# Patient Record
Sex: Female | Born: 1960 | Race: Black or African American | Hispanic: No | Marital: Single | Smoking: Never smoker
Health system: Southern US, Community
[De-identification: ages and names within clinical notes are randomized; demographics above are authoritative.]

## PROBLEM LIST (undated history)

## (undated) HISTORY — PX: KNEE SURGERY: SHX244

---

## 1998-01-31 ENCOUNTER — Encounter: Payer: Self-pay | Admitting: Orthopedic Surgery

## 1998-02-04 ENCOUNTER — Ambulatory Visit (HOSPITAL_COMMUNITY): Admission: RE | Admit: 1998-02-04 | Discharge: 1998-02-04 | Payer: Self-pay | Admitting: Orthopedic Surgery

## 1998-03-18 ENCOUNTER — Encounter: Admission: RE | Admit: 1998-03-18 | Discharge: 1998-05-16 | Payer: Self-pay | Admitting: Orthopedic Surgery

## 2007-03-09 HISTORY — PX: LAPAROSCOPIC GASTRIC BANDING: SHX1100

## 2012-04-02 DIAGNOSIS — M25579 Pain in unspecified ankle and joints of unspecified foot: Secondary | ICD-10-CM | POA: Insufficient documentation

## 2012-04-02 DIAGNOSIS — M7989 Other specified soft tissue disorders: Secondary | ICD-10-CM | POA: Insufficient documentation

## 2012-04-02 DIAGNOSIS — M25569 Pain in unspecified knee: Secondary | ICD-10-CM | POA: Insufficient documentation

## 2012-06-03 DIAGNOSIS — I10 Essential (primary) hypertension: Secondary | ICD-10-CM | POA: Insufficient documentation

## 2012-06-03 DIAGNOSIS — G40909 Epilepsy, unspecified, not intractable, without status epilepticus: Secondary | ICD-10-CM | POA: Insufficient documentation

## 2014-06-21 DIAGNOSIS — R7303 Prediabetes: Secondary | ICD-10-CM | POA: Insufficient documentation

## 2014-08-22 DIAGNOSIS — G40909 Epilepsy, unspecified, not intractable, without status epilepticus: Secondary | ICD-10-CM | POA: Insufficient documentation

## 2017-01-10 DIAGNOSIS — H905 Unspecified sensorineural hearing loss: Secondary | ICD-10-CM | POA: Insufficient documentation

## 2017-05-05 DIAGNOSIS — D500 Iron deficiency anemia secondary to blood loss (chronic): Secondary | ICD-10-CM | POA: Insufficient documentation

## 2018-01-13 DIAGNOSIS — E876 Hypokalemia: Secondary | ICD-10-CM | POA: Insufficient documentation

## 2018-03-09 DIAGNOSIS — Z30433 Encounter for removal and reinsertion of intrauterine contraceptive device: Secondary | ICD-10-CM | POA: Diagnosis not present

## 2018-03-22 DIAGNOSIS — Z6841 Body Mass Index (BMI) 40.0 and over, adult: Secondary | ICD-10-CM | POA: Diagnosis not present

## 2018-03-22 DIAGNOSIS — I10 Essential (primary) hypertension: Secondary | ICD-10-CM | POA: Diagnosis not present

## 2018-03-22 DIAGNOSIS — Z09 Encounter for follow-up examination after completed treatment for conditions other than malignant neoplasm: Secondary | ICD-10-CM | POA: Diagnosis not present

## 2018-03-22 DIAGNOSIS — I82403 Acute embolism and thrombosis of unspecified deep veins of lower extremity, bilateral: Secondary | ICD-10-CM | POA: Diagnosis not present

## 2018-03-22 DIAGNOSIS — F329 Major depressive disorder, single episode, unspecified: Secondary | ICD-10-CM | POA: Diagnosis not present

## 2018-03-29 DIAGNOSIS — F329 Major depressive disorder, single episode, unspecified: Secondary | ICD-10-CM | POA: Diagnosis not present

## 2018-04-03 DIAGNOSIS — F329 Major depressive disorder, single episode, unspecified: Secondary | ICD-10-CM | POA: Diagnosis not present

## 2018-04-06 MED FILL — TROKENDI XR 100 MG CAPSULE: 100 | 30 days supply | Qty: 30 | Fill #0

## 2018-04-10 DIAGNOSIS — Z30431 Encounter for routine checking of intrauterine contraceptive device: Secondary | ICD-10-CM | POA: Diagnosis not present

## 2018-04-10 DIAGNOSIS — F329 Major depressive disorder, single episode, unspecified: Secondary | ICD-10-CM | POA: Diagnosis not present

## 2018-04-12 MED FILL — TRIAMTERENE/HCTZ 37.5/25 TB: 37.5-25 | 30 days supply | Qty: 30 | Fill #0

## 2018-04-13 DIAGNOSIS — G40909 Epilepsy, unspecified, not intractable, without status epilepticus: Secondary | ICD-10-CM | POA: Diagnosis not present

## 2018-04-13 DIAGNOSIS — Z5181 Encounter for therapeutic drug level monitoring: Secondary | ICD-10-CM | POA: Diagnosis not present

## 2018-05-04 DIAGNOSIS — D500 Iron deficiency anemia secondary to blood loss (chronic): Secondary | ICD-10-CM | POA: Diagnosis not present

## 2018-05-04 DIAGNOSIS — I825Y9 Chronic embolism and thrombosis of unspecified deep veins of unspecified proximal lower extremity: Secondary | ICD-10-CM | POA: Diagnosis not present

## 2018-05-04 MED FILL — TROKENDI XR 100 MG CAPSULE: 100 | 90 days supply | Qty: 90 | Fill #0

## 2018-05-12 MED FILL — TRIAMTERENE/HCTZ 37.5/25 TB: 37.5-25 | 30 days supply | Qty: 30 | Fill #0

## 2018-05-15 DIAGNOSIS — Z6841 Body Mass Index (BMI) 40.0 and over, adult: Secondary | ICD-10-CM | POA: Diagnosis not present

## 2018-05-15 DIAGNOSIS — I82409 Acute embolism and thrombosis of unspecified deep veins of unspecified lower extremity: Secondary | ICD-10-CM | POA: Diagnosis not present

## 2018-05-15 DIAGNOSIS — F329 Major depressive disorder, single episode, unspecified: Secondary | ICD-10-CM | POA: Diagnosis not present

## 2018-05-15 DIAGNOSIS — D500 Iron deficiency anemia secondary to blood loss (chronic): Secondary | ICD-10-CM | POA: Diagnosis not present

## 2018-05-15 DIAGNOSIS — R7303 Prediabetes: Secondary | ICD-10-CM | POA: Diagnosis not present

## 2018-05-15 DIAGNOSIS — I10 Essential (primary) hypertension: Secondary | ICD-10-CM | POA: Diagnosis not present

## 2018-05-24 MED FILL — ESCITALOPRAM 20 MG TABLET: 20 | 30 days supply | Qty: 30 | Fill #0

## 2018-06-08 MED FILL — TRIAMTERENE/HCTZ 37.5/25 TB: 37.5-25 | 90 days supply | Qty: 90 | Fill #0

## 2018-06-08 MED FILL — ESCITALOPRAM 20 MG TABLET: 20 | 90 days supply | Qty: 90 | Fill #0

## 2018-08-03 MED FILL — TROKENDI XR 100 MG CAPSULE: 100 | 90 days supply | Qty: 90 | Fill #1

## 2018-09-04 MED FILL — TRIAMTERENE/HCTZ 37.5/25 TB: 37.5-25 | 90 days supply | Qty: 90 | Fill #0

## 2018-09-04 MED FILL — ESCITALOPRAM 20 MG TABLET: 20 | 90 days supply | Qty: 90 | Fill #0

## 2018-11-02 DIAGNOSIS — I8290 Acute embolism and thrombosis of unspecified vein: Secondary | ICD-10-CM | POA: Diagnosis not present

## 2018-11-02 DIAGNOSIS — D500 Iron deficiency anemia secondary to blood loss (chronic): Secondary | ICD-10-CM | POA: Diagnosis not present

## 2018-11-02 MED FILL — TROKENDI XR 100 MG CAPSULE: 100 | 90 days supply | Qty: 90 | Fill #2

## 2018-11-24 MED FILL — XARELTO 20 MG TABLET: 20 | 30 days supply | Qty: 30 | Fill #0

## 2018-11-27 DIAGNOSIS — B373 Candidiasis of vulva and vagina: Secondary | ICD-10-CM | POA: Diagnosis not present

## 2018-11-27 DIAGNOSIS — Z202 Contact with and (suspected) exposure to infections with a predominantly sexual mode of transmission: Secondary | ICD-10-CM | POA: Diagnosis not present

## 2018-11-27 DIAGNOSIS — Z6841 Body Mass Index (BMI) 40.0 and over, adult: Secondary | ICD-10-CM | POA: Diagnosis not present

## 2018-11-28 DIAGNOSIS — Z202 Contact with and (suspected) exposure to infections with a predominantly sexual mode of transmission: Secondary | ICD-10-CM | POA: Diagnosis not present

## 2018-12-07 ENCOUNTER — Encounter: Payer: Self-pay | Admitting: Psychiatry

## 2018-12-07 ENCOUNTER — Ambulatory Visit (INDEPENDENT_AMBULATORY_CARE_PROVIDER_SITE_OTHER): Payer: 59 | Admitting: Psychiatry

## 2018-12-07 ENCOUNTER — Other Ambulatory Visit: Payer: Self-pay

## 2018-12-07 DIAGNOSIS — F331 Major depressive disorder, recurrent, moderate: Secondary | ICD-10-CM

## 2018-12-07 MED FILL — TRIAMTERENE-HCTZ 37.5-25 MG: 37.5-25 | 90 days supply | Qty: 90 | Fill #0

## 2018-12-07 MED FILL — ESCITALOPRAM 20 MG TABLET: 20 | 90 days supply | Qty: 90 | Fill #0
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
## 2018-12-28 ENCOUNTER — Ambulatory Visit: Payer: 59 | Admitting: Psychiatry

## 2018-12-28 DIAGNOSIS — F329 Major depressive disorder, single episode, unspecified: Secondary | ICD-10-CM | POA: Diagnosis not present

## 2018-12-28 DIAGNOSIS — Z13220 Encounter for screening for lipoid disorders: Secondary | ICD-10-CM | POA: Diagnosis not present

## 2018-12-28 DIAGNOSIS — I10 Essential (primary) hypertension: Secondary | ICD-10-CM | POA: Diagnosis not present

## 2018-12-28 DIAGNOSIS — I82409 Acute embolism and thrombosis of unspecified deep veins of unspecified lower extremity: Secondary | ICD-10-CM | POA: Diagnosis not present

## 2018-12-28 DIAGNOSIS — R7303 Prediabetes: Secondary | ICD-10-CM | POA: Diagnosis not present

## 2018-12-28 DIAGNOSIS — J309 Allergic rhinitis, unspecified: Secondary | ICD-10-CM | POA: Diagnosis not present

## 2018-12-28 DIAGNOSIS — D500 Iron deficiency anemia secondary to blood loss (chronic): Secondary | ICD-10-CM | POA: Diagnosis not present

## 2018-12-28 DIAGNOSIS — Z1329 Encounter for screening for other suspected endocrine disorder: Secondary | ICD-10-CM | POA: Diagnosis not present

## 2018-12-28 DIAGNOSIS — E876 Hypokalemia: Secondary | ICD-10-CM | POA: Diagnosis not present

## 2018-12-29 MED FILL — XARELTO 20 MG TABLET: 20 | 30 days supply | Qty: 30 | Fill #1

## 2018-12-29 MED FILL — FLUTICASONE PROP 50 MCG SPR: 50 | 60 days supply | Qty: 16 | Fill #0

## 2019-01-01 MED FILL — POTASSIUM CHLORIDE CRYS ER: 10 | 5 days supply | Qty: 5 | Fill #0

## 2019-01-10 DIAGNOSIS — E876 Hypokalemia: Secondary | ICD-10-CM | POA: Diagnosis not present

## 2019-01-30 DIAGNOSIS — F331 Major depressive disorder, recurrent, moderate: Secondary | ICD-10-CM | POA: Diagnosis not present

## 2019-01-31 DIAGNOSIS — Z20828 Contact with and (suspected) exposure to other viral communicable diseases: Secondary | ICD-10-CM | POA: Diagnosis not present

## 2019-02-02 MED FILL — TROKENDI XR 100 MG CAPSULE: 100 | 90 days supply | Qty: 90 | Fill #0

## 2019-02-02 MED FILL — XARELTO 20 MG TABLET: 20 | 30 days supply | Qty: 30 | Fill #0 | Status: TO

## 2019-02-05 DIAGNOSIS — Z1231 Encounter for screening mammogram for malignant neoplasm of breast: Secondary | ICD-10-CM | POA: Diagnosis not present

## 2019-02-26 MED FILL — XARELTO 20 MG TABLET: 20 | 30 days supply | Qty: 30 | Fill #1 | Status: TO

## 2019-03-08 MED FILL — XARELTO 20 MG TABLET: 20 | 30 days supply | Qty: 30 | Fill #1 | Status: TO

## 2019-03-12 MED FILL — ESCITALOPRAM 20 MG TABLET: 20 | 90 days supply | Qty: 90 | Fill #0

## 2019-03-12 MED FILL — TRIAMTERENE-HCTZ 37.5-25 MG: 37.5-25 | 30 days supply | Qty: 30 | Fill #0

## 2019-04-09 MED FILL — XARELTO 20 MG TABLET: 20 | 30 days supply | Qty: 30 | Fill #2 | Status: TO

## 2019-04-10 MED FILL — TRIAMTERENE/HCTZ 37.5/25 TB: 37.5-25 | 90 days supply | Qty: 90 | Fill #1

## 2019-05-08 MED FILL — XARELTO 20 MG TABLET: 20 | 30 days supply | Qty: 30 | Fill #0 | Status: TO

## 2019-05-11 MED FILL — TROKENDI XR 100 MG CAPSULE: 100 | 30 days supply | Qty: 30 | Fill #0

## 2019-05-14 ENCOUNTER — Other Ambulatory Visit (HOSPITAL_COMMUNITY): Payer: Self-pay | Admitting: Nurse Practitioner

## 2019-05-14 DIAGNOSIS — E569 Vitamin deficiency, unspecified: Secondary | ICD-10-CM | POA: Insufficient documentation

## 2019-05-14 DIAGNOSIS — Z5181 Encounter for therapeutic drug level monitoring: Secondary | ICD-10-CM | POA: Diagnosis not present

## 2019-05-14 DIAGNOSIS — Z79899 Other long term (current) drug therapy: Secondary | ICD-10-CM | POA: Diagnosis not present

## 2019-05-14 DIAGNOSIS — G40909 Epilepsy, unspecified, not intractable, without status epilepticus: Secondary | ICD-10-CM | POA: Diagnosis not present

## 2019-05-18 MED FILL — VIT D2 1.25 MG (50,000 UNIT: 1.25 MG | 84 days supply | Qty: 12 | Fill #0

## 2019-06-07 DIAGNOSIS — E876 Hypokalemia: Secondary | ICD-10-CM | POA: Diagnosis not present

## 2019-06-07 DIAGNOSIS — J309 Allergic rhinitis, unspecified: Secondary | ICD-10-CM | POA: Diagnosis not present

## 2019-06-07 DIAGNOSIS — D500 Iron deficiency anemia secondary to blood loss (chronic): Secondary | ICD-10-CM | POA: Diagnosis not present

## 2019-06-07 DIAGNOSIS — F329 Major depressive disorder, single episode, unspecified: Secondary | ICD-10-CM | POA: Diagnosis not present

## 2019-06-07 DIAGNOSIS — I82409 Acute embolism and thrombosis of unspecified deep veins of unspecified lower extremity: Secondary | ICD-10-CM | POA: Diagnosis not present

## 2019-06-07 DIAGNOSIS — I10 Essential (primary) hypertension: Secondary | ICD-10-CM | POA: Diagnosis not present

## 2019-06-07 DIAGNOSIS — R7303 Prediabetes: Secondary | ICD-10-CM | POA: Diagnosis not present

## 2019-06-14 MED FILL — XARELTO 20 MG TABLET: 20 | 30 days supply | Qty: 30 | Fill #1 | Status: TO

## 2019-06-15 MED FILL — TROKENDI XR 100 MG CAPSULE: 100 | 90 days supply | Qty: 90 | Fill #0

## 2019-07-10 MED FILL — XARELTO 20 MG TABLET: 20 | 30 days supply | Qty: 30 | Fill #2 | Status: TO

## 2019-07-10 MED FILL — ESCITALOPRAM 20 MG TABLET: 20 | 90 days supply | Qty: 90 | Fill #1

## 2019-08-02 DIAGNOSIS — Z09 Encounter for follow-up examination after completed treatment for conditions other than malignant neoplasm: Secondary | ICD-10-CM | POA: Diagnosis not present

## 2019-08-02 DIAGNOSIS — Z7901 Long term (current) use of anticoagulants: Secondary | ICD-10-CM | POA: Diagnosis not present

## 2019-08-02 DIAGNOSIS — Z9884 Bariatric surgery status: Secondary | ICD-10-CM | POA: Diagnosis not present

## 2019-08-02 DIAGNOSIS — D500 Iron deficiency anemia secondary to blood loss (chronic): Secondary | ICD-10-CM | POA: Diagnosis not present

## 2019-08-02 DIAGNOSIS — Z862 Personal history of diseases of the blood and blood-forming organs and certain disorders involving the immune mechanism: Secondary | ICD-10-CM | POA: Diagnosis not present

## 2019-08-02 DIAGNOSIS — Z86718 Personal history of other venous thrombosis and embolism: Secondary | ICD-10-CM | POA: Diagnosis not present

## 2019-08-08 DIAGNOSIS — Z01419 Encounter for gynecological examination (general) (routine) without abnormal findings: Secondary | ICD-10-CM | POA: Diagnosis not present

## 2019-08-09 DIAGNOSIS — Z113 Encounter for screening for infections with a predominantly sexual mode of transmission: Secondary | ICD-10-CM | POA: Diagnosis not present

## 2019-09-17 MED FILL — TROKENDI XR 100 MG CAPSULE: 100 | 90 days supply | Qty: 90 | Fill #1

## 2019-09-17 MED FILL — TRIAMTERENE-HCTZ 37.5-25 MG: 37.5-25 | 60 days supply | Qty: 60 | Fill #0

## 2019-09-24 ENCOUNTER — Other Ambulatory Visit (HOSPITAL_COMMUNITY): Payer: Self-pay

## 2019-09-25 MED FILL — XARELTO 20 MG TABLET: 20 | 30 days supply | Qty: 30 | Fill #0

## 2019-10-02 DIAGNOSIS — H9201 Otalgia, right ear: Secondary | ICD-10-CM | POA: Diagnosis not present

## 2019-10-02 DIAGNOSIS — Z6841 Body Mass Index (BMI) 40.0 and over, adult: Secondary | ICD-10-CM | POA: Diagnosis not present

## 2019-10-15 ENCOUNTER — Other Ambulatory Visit (HOSPITAL_COMMUNITY): Payer: Self-pay

## 2019-10-15 MED FILL — ESCITALOPRAM 20 MG TABLET: 20 | 90 days supply | Qty: 90 | Fill #0

## 2019-10-23 MED FILL — XARELTO 20 MG TABLET: 20 | 30 days supply | Qty: 30 | Fill #1

## 2019-11-02 DIAGNOSIS — Z23 Encounter for immunization: Secondary | ICD-10-CM | POA: Diagnosis not present

## 2019-11-20 MED FILL — XARELTO 20 MG TABLET: 20 | 30 days supply | Qty: 30 | Fill #2

## 2019-11-22 MED FILL — TRIAMTERENE-HCTZ 37.5-25 MG: 37.5-25 | 60 days supply | Qty: 60 | Fill #1

## 2019-12-12 DIAGNOSIS — Z20828 Contact with and (suspected) exposure to other viral communicable diseases: Secondary | ICD-10-CM | POA: Diagnosis not present

## 2019-12-12 DIAGNOSIS — D500 Iron deficiency anemia secondary to blood loss (chronic): Secondary | ICD-10-CM | POA: Diagnosis not present

## 2019-12-24 MED FILL — TROKENDI XR 100 MG CAPSULE: 100 | 90 days supply | Qty: 90 | Fill #2

## 2019-12-24 MED FILL — XARELTO 20 MG TABLET: 20 | 30 days supply | Qty: 30 | Fill #3

## 2020-01-16 ENCOUNTER — Other Ambulatory Visit (HOSPITAL_COMMUNITY): Payer: Self-pay | Admitting: Family Medicine

## 2020-01-16 MED FILL — TRIAMTERENE-HCTZ 37.5-25 MG: 37.5-25 | 90 days supply | Qty: 90 | Fill #0

## 2020-01-16 MED FILL — ESCITALOPRAM 20 MG TABLET: 20 | 90 days supply | Qty: 90 | Fill #1

## 2020-01-17 MED FILL — XARELTO 20 MG TABLET: 20 | 30 days supply | Qty: 30 | Fill #4

## 2020-02-19 MED FILL — XARELTO 20 MG TABLET: 20 | 30 days supply | Qty: 30 | Fill #5

## 2020-03-05 DIAGNOSIS — Z20822 Contact with and (suspected) exposure to covid-19: Secondary | ICD-10-CM | POA: Diagnosis not present

## 2020-03-25 MED FILL — TROKENDI XR 100 MG CAPSULE: 100 | 90 days supply | Qty: 90 | Fill #3

## 2020-03-25 MED FILL — XARELTO 20 MG TABLET: 20 | 30 days supply | Qty: 30 | Fill #6

## 2020-04-02 DIAGNOSIS — Z1231 Encounter for screening mammogram for malignant neoplasm of breast: Secondary | ICD-10-CM | POA: Diagnosis not present

## 2020-04-02 DIAGNOSIS — Z975 Presence of (intrauterine) contraceptive device: Secondary | ICD-10-CM | POA: Diagnosis not present

## 2020-04-02 DIAGNOSIS — N930 Postcoital and contact bleeding: Secondary | ICD-10-CM | POA: Diagnosis not present

## 2020-04-02 DIAGNOSIS — F4321 Adjustment disorder with depressed mood: Secondary | ICD-10-CM | POA: Diagnosis not present

## 2020-04-02 DIAGNOSIS — N951 Menopausal and female climacteric states: Secondary | ICD-10-CM | POA: Diagnosis not present

## 2020-04-02 DIAGNOSIS — Z6841 Body Mass Index (BMI) 40.0 and over, adult: Secondary | ICD-10-CM | POA: Diagnosis not present

## 2020-04-02 MED FILL — ESCITALOPRAM 10 MG TABLET: 10 | 30 days supply | Qty: 30 | Fill #0

## 2020-04-03 ENCOUNTER — Other Ambulatory Visit (HOSPITAL_COMMUNITY): Payer: Self-pay

## 2020-04-03 DIAGNOSIS — Z7901 Long term (current) use of anticoagulants: Secondary | ICD-10-CM | POA: Diagnosis not present

## 2020-04-03 DIAGNOSIS — Z6841 Body Mass Index (BMI) 40.0 and over, adult: Secondary | ICD-10-CM | POA: Diagnosis not present

## 2020-04-03 DIAGNOSIS — D500 Iron deficiency anemia secondary to blood loss (chronic): Secondary | ICD-10-CM | POA: Diagnosis not present

## 2020-04-03 DIAGNOSIS — Z9884 Bariatric surgery status: Secondary | ICD-10-CM | POA: Diagnosis not present

## 2020-04-03 DIAGNOSIS — Z86718 Personal history of other venous thrombosis and embolism: Secondary | ICD-10-CM | POA: Diagnosis not present

## 2020-04-03 DIAGNOSIS — Z1231 Encounter for screening mammogram for malignant neoplasm of breast: Secondary | ICD-10-CM | POA: Diagnosis not present

## 2020-04-17 ENCOUNTER — Other Ambulatory Visit (HOSPITAL_COMMUNITY): Payer: Self-pay | Admitting: Neurology

## 2020-04-17 DIAGNOSIS — R87612 Low grade squamous intraepithelial lesion on cytologic smear of cervix (LGSIL): Secondary | ICD-10-CM | POA: Diagnosis not present

## 2020-04-17 DIAGNOSIS — Z975 Presence of (intrauterine) contraceptive device: Secondary | ICD-10-CM | POA: Diagnosis not present

## 2020-04-17 DIAGNOSIS — N938 Other specified abnormal uterine and vaginal bleeding: Secondary | ICD-10-CM | POA: Diagnosis not present

## 2020-04-17 DIAGNOSIS — R9389 Abnormal findings on diagnostic imaging of other specified body structures: Secondary | ICD-10-CM | POA: Diagnosis not present

## 2020-04-17 DIAGNOSIS — N951 Menopausal and female climacteric states: Secondary | ICD-10-CM | POA: Diagnosis not present

## 2020-04-17 DIAGNOSIS — G40909 Epilepsy, unspecified, not intractable, without status epilepticus: Secondary | ICD-10-CM | POA: Diagnosis not present

## 2020-04-18 DIAGNOSIS — D500 Iron deficiency anemia secondary to blood loss (chronic): Secondary | ICD-10-CM | POA: Diagnosis not present

## 2020-04-22 ENCOUNTER — Other Ambulatory Visit (HOSPITAL_COMMUNITY): Payer: Self-pay | Admitting: Family Medicine

## 2020-04-22 MED FILL — TRIAMTERENE-HCTZ 37.5-25 MG: 37.5-25 | 90 days supply | Qty: 90 | Fill #0

## 2020-04-30 MED FILL — XARELTO 20 MG TABLET: 20 | 90 days supply | Qty: 90 | Fill #0

## 2020-05-01 ENCOUNTER — Other Ambulatory Visit (HOSPITAL_COMMUNITY): Payer: Self-pay | Admitting: Gynecology

## 2020-05-01 MED FILL — miSOPROStol 200 MCG TABS: 200 | 1 days supply | Qty: 2 | Fill #0

## 2020-05-02 ENCOUNTER — Other Ambulatory Visit (HOSPITAL_COMMUNITY): Payer: Self-pay | Admitting: Family Medicine

## 2020-05-02 MED FILL — ESCITALOPRAM 20 MG TABLET: 20 | 90 days supply | Qty: 90 | Fill #0

## 2020-06-02 ENCOUNTER — Other Ambulatory Visit (HOSPITAL_COMMUNITY): Payer: Self-pay | Admitting: Gynecology

## 2020-06-02 MED FILL — miSOPROStol 200 MCG TABS: 200 | 1 days supply | Qty: 2 | Fill #0

## 2020-06-04 DIAGNOSIS — Z30433 Encounter for removal and reinsertion of intrauterine contraceptive device: Secondary | ICD-10-CM | POA: Diagnosis not present

## 2020-06-19 ENCOUNTER — Other Ambulatory Visit (HOSPITAL_COMMUNITY): Payer: Self-pay

## 2020-06-19 DIAGNOSIS — D500 Iron deficiency anemia secondary to blood loss (chronic): Secondary | ICD-10-CM | POA: Diagnosis not present

## 2020-06-19 DIAGNOSIS — F341 Dysthymic disorder: Secondary | ICD-10-CM | POA: Diagnosis not present

## 2020-06-19 DIAGNOSIS — I10 Essential (primary) hypertension: Secondary | ICD-10-CM | POA: Diagnosis not present

## 2020-06-19 DIAGNOSIS — I82409 Acute embolism and thrombosis of unspecified deep veins of unspecified lower extremity: Secondary | ICD-10-CM | POA: Diagnosis not present

## 2020-06-19 DIAGNOSIS — R7303 Prediabetes: Secondary | ICD-10-CM | POA: Diagnosis not present

## 2020-06-19 DIAGNOSIS — D649 Anemia, unspecified: Secondary | ICD-10-CM | POA: Diagnosis not present

## 2020-06-19 DIAGNOSIS — E7800 Pure hypercholesterolemia, unspecified: Secondary | ICD-10-CM | POA: Insufficient documentation

## 2020-06-19 DIAGNOSIS — Z9884 Bariatric surgery status: Secondary | ICD-10-CM | POA: Diagnosis not present

## 2020-06-19 MED ORDER — FLUOXETINE HCL 40 MG PO CAPS
40.0000 mg | ORAL_CAPSULE | Freq: Every day | ORAL | 3 refills | Status: DC
  Filled 2020-06-19: qty 30, 30d supply, fill #0
  Filled 2020-07-25: qty 30, 30d supply, fill #1
  Filled 2020-08-26: qty 30, 30d supply, fill #2
  Filled 2020-09-26: qty 30, 30d supply, fill #3

## 2020-06-19 MED ORDER — TRIAMTERENE-HCTZ 37.5-25 MG PO TABS
1.0000 | ORAL_TABLET | Freq: Every day | ORAL | 1 refills | Status: DC
  Filled 2020-06-19 – 2020-07-24 (×2): qty 90, 90d supply, fill #0
  Filled 2020-10-20: qty 90, 90d supply, fill #1

## 2020-06-20 ENCOUNTER — Other Ambulatory Visit (HOSPITAL_COMMUNITY): Payer: Self-pay

## 2020-06-20 MED ORDER — POTASSIUM CHLORIDE CRYS ER 10 MEQ PO TBCR
10.0000 meq | EXTENDED_RELEASE_TABLET | Freq: Every day | ORAL | 1 refills | Status: DC
  Filled 2020-06-20: qty 90, 90d supply, fill #0
  Filled 2020-09-26: qty 90, 90d supply, fill #1

## 2020-06-24 ENCOUNTER — Other Ambulatory Visit (HOSPITAL_COMMUNITY): Payer: Self-pay

## 2020-06-26 ENCOUNTER — Other Ambulatory Visit (HOSPITAL_COMMUNITY): Payer: Self-pay

## 2020-06-28 ENCOUNTER — Other Ambulatory Visit (HOSPITAL_COMMUNITY): Payer: Self-pay

## 2020-06-30 ENCOUNTER — Other Ambulatory Visit (HOSPITAL_COMMUNITY): Payer: Self-pay

## 2020-07-01 ENCOUNTER — Other Ambulatory Visit (HOSPITAL_COMMUNITY): Payer: Self-pay

## 2020-07-02 ENCOUNTER — Other Ambulatory Visit (HOSPITAL_COMMUNITY): Payer: Self-pay

## 2020-07-02 MED FILL — Topiramate Cap ER 24HR 100 MG: ORAL | 90 days supply | Qty: 90 | Fill #0 | Status: AC

## 2020-07-03 ENCOUNTER — Other Ambulatory Visit (HOSPITAL_COMMUNITY): Payer: Self-pay

## 2020-07-03 MED ORDER — TROKENDI XR 100 MG PO CP24
1.0000 | ORAL_CAPSULE | Freq: Every day | ORAL | 1 refills | Status: DC
  Filled 2020-07-03 – 2021-07-01 (×2): qty 90, 90d supply, fill #0

## 2020-07-04 DIAGNOSIS — Z30431 Encounter for routine checking of intrauterine contraceptive device: Secondary | ICD-10-CM | POA: Diagnosis not present

## 2020-07-04 DIAGNOSIS — Z975 Presence of (intrauterine) contraceptive device: Secondary | ICD-10-CM | POA: Insufficient documentation

## 2020-07-16 ENCOUNTER — Other Ambulatory Visit (HOSPITAL_COMMUNITY): Payer: Self-pay

## 2020-07-23 ENCOUNTER — Other Ambulatory Visit (HOSPITAL_COMMUNITY): Payer: Self-pay

## 2020-07-23 MED FILL — Rivaroxaban Tab 20 MG: ORAL | 90 days supply | Qty: 90 | Fill #0 | Status: AC

## 2020-07-24 ENCOUNTER — Other Ambulatory Visit (HOSPITAL_COMMUNITY): Payer: Self-pay

## 2020-07-25 ENCOUNTER — Other Ambulatory Visit (HOSPITAL_COMMUNITY): Payer: Self-pay

## 2020-07-28 ENCOUNTER — Other Ambulatory Visit (HOSPITAL_COMMUNITY): Payer: Self-pay

## 2020-07-31 DIAGNOSIS — H1010 Acute atopic conjunctivitis, unspecified eye: Secondary | ICD-10-CM | POA: Diagnosis not present

## 2020-07-31 DIAGNOSIS — J301 Allergic rhinitis due to pollen: Secondary | ICD-10-CM | POA: Diagnosis not present

## 2020-07-31 DIAGNOSIS — L270 Generalized skin eruption due to drugs and medicaments taken internally: Secondary | ICD-10-CM | POA: Diagnosis not present

## 2020-08-01 DIAGNOSIS — D500 Iron deficiency anemia secondary to blood loss (chronic): Secondary | ICD-10-CM | POA: Diagnosis not present

## 2020-08-26 ENCOUNTER — Other Ambulatory Visit (HOSPITAL_COMMUNITY): Payer: Self-pay

## 2020-09-09 ENCOUNTER — Other Ambulatory Visit (HOSPITAL_COMMUNITY): Payer: Self-pay

## 2020-09-09 MED FILL — Topiramate Cap ER 24HR 100 MG: ORAL | 90 days supply | Qty: 90 | Fill #1 | Status: CN

## 2020-09-11 ENCOUNTER — Other Ambulatory Visit (HOSPITAL_COMMUNITY): Payer: Self-pay

## 2020-09-12 ENCOUNTER — Other Ambulatory Visit (HOSPITAL_COMMUNITY): Payer: Self-pay

## 2020-09-12 DIAGNOSIS — J0110 Acute frontal sinusitis, unspecified: Secondary | ICD-10-CM | POA: Diagnosis not present

## 2020-09-12 MED FILL — Topiramate Cap ER 24HR 100 MG: ORAL | 90 days supply | Qty: 90 | Fill #1 | Status: AC

## 2020-09-26 ENCOUNTER — Other Ambulatory Visit (HOSPITAL_COMMUNITY): Payer: Self-pay

## 2020-10-20 ENCOUNTER — Other Ambulatory Visit (HOSPITAL_COMMUNITY): Payer: Self-pay

## 2020-10-20 MED FILL — Rivaroxaban Tab 20 MG: ORAL | 90 days supply | Qty: 90 | Fill #1 | Status: AC

## 2020-10-21 ENCOUNTER — Other Ambulatory Visit (HOSPITAL_COMMUNITY): Payer: Self-pay

## 2020-10-21 MED ORDER — FLUOXETINE HCL 40 MG PO CAPS
40.0000 mg | ORAL_CAPSULE | Freq: Every day | ORAL | 2 refills | Status: DC
Start: 2020-10-21 — End: 2021-01-26
  Filled 2020-10-21: qty 30, 30d supply, fill #0
  Filled 2020-12-01: qty 30, 30d supply, fill #1
  Filled 2020-12-29: qty 30, 30d supply, fill #2

## 2020-12-01 ENCOUNTER — Other Ambulatory Visit (HOSPITAL_COMMUNITY): Payer: Self-pay

## 2020-12-03 DIAGNOSIS — D500 Iron deficiency anemia secondary to blood loss (chronic): Secondary | ICD-10-CM | POA: Diagnosis not present

## 2020-12-21 MED FILL — Topiramate Cap ER 24HR 100 MG: ORAL | 90 days supply | Qty: 90 | Fill #2 | Status: AC

## 2020-12-22 ENCOUNTER — Other Ambulatory Visit (HOSPITAL_COMMUNITY): Payer: Self-pay

## 2020-12-29 ENCOUNTER — Other Ambulatory Visit (HOSPITAL_COMMUNITY): Payer: Self-pay

## 2020-12-30 ENCOUNTER — Other Ambulatory Visit (HOSPITAL_COMMUNITY): Payer: Self-pay

## 2020-12-30 MED ORDER — POTASSIUM CHLORIDE CRYS ER 10 MEQ PO TBCR
10.0000 meq | EXTENDED_RELEASE_TABLET | Freq: Every day | ORAL | 0 refills | Status: DC
  Filled 2020-12-30: qty 30, 30d supply, fill #0

## 2021-01-21 DIAGNOSIS — F341 Dysthymic disorder: Secondary | ICD-10-CM | POA: Diagnosis not present

## 2021-01-21 DIAGNOSIS — K580 Irritable bowel syndrome with diarrhea: Secondary | ICD-10-CM | POA: Diagnosis not present

## 2021-01-21 DIAGNOSIS — I10 Essential (primary) hypertension: Secondary | ICD-10-CM | POA: Diagnosis not present

## 2021-01-21 DIAGNOSIS — G40909 Epilepsy, unspecified, not intractable, without status epilepticus: Secondary | ICD-10-CM | POA: Diagnosis not present

## 2021-01-21 DIAGNOSIS — Z23 Encounter for immunization: Secondary | ICD-10-CM | POA: Diagnosis not present

## 2021-01-21 DIAGNOSIS — R7303 Prediabetes: Secondary | ICD-10-CM | POA: Diagnosis not present

## 2021-01-24 ENCOUNTER — Other Ambulatory Visit (HOSPITAL_COMMUNITY): Payer: Self-pay

## 2021-01-24 MED FILL — Rivaroxaban Tab 20 MG: ORAL | 90 days supply | Qty: 90 | Fill #2 | Status: AC

## 2021-01-26 ENCOUNTER — Other Ambulatory Visit (HOSPITAL_COMMUNITY): Payer: Self-pay

## 2021-01-26 MED ORDER — POTASSIUM CHLORIDE CRYS ER 10 MEQ PO TBCR
10.0000 meq | EXTENDED_RELEASE_TABLET | Freq: Every day | ORAL | 3 refills | Status: DC
  Filled 2021-01-26: qty 90, 90d supply, fill #0
  Filled 2021-06-01: qty 90, 90d supply, fill #1

## 2021-01-26 MED ORDER — FLUOXETINE HCL 20 MG PO CAPS
20.0000 mg | ORAL_CAPSULE | Freq: Every day | ORAL | 3 refills | Status: DC
  Filled 2021-01-26: qty 90, 90d supply, fill #0
  Filled 2021-04-30: qty 90, 90d supply, fill #1
  Filled 2021-08-02: qty 90, 90d supply, fill #2
  Filled 2021-11-01: qty 90, 90d supply, fill #3

## 2021-01-26 MED ORDER — FLUOXETINE HCL 40 MG PO CAPS
40.0000 mg | ORAL_CAPSULE | Freq: Every day | ORAL | 3 refills | Status: DC
  Filled 2021-01-26: qty 90, 90d supply, fill #0
  Filled 2021-04-30: qty 90, 90d supply, fill #1
  Filled 2021-08-02: qty 90, 90d supply, fill #2
  Filled 2021-11-01: qty 90, 90d supply, fill #3

## 2021-01-26 MED ORDER — TRIAMTERENE-HCTZ 37.5-25 MG PO TABS
1.0000 | ORAL_TABLET | Freq: Every day | ORAL | 1 refills | Status: DC
  Filled 2021-01-26: qty 90, 90d supply, fill #0
  Filled 2021-04-30: qty 90, 90d supply, fill #1

## 2021-01-27 ENCOUNTER — Other Ambulatory Visit (HOSPITAL_COMMUNITY): Payer: Self-pay

## 2021-01-28 ENCOUNTER — Other Ambulatory Visit (HOSPITAL_COMMUNITY): Payer: Self-pay

## 2021-01-28 MED ORDER — FAMOTIDINE 40 MG PO TABS
40.0000 mg | ORAL_TABLET | Freq: Every evening | ORAL | 1 refills | Status: AC | PRN
  Filled 2021-01-28: qty 30, 30d supply, fill #0

## 2021-02-02 DIAGNOSIS — H90A32 Mixed conductive and sensorineural hearing loss, unilateral, left ear with restricted hearing on the contralateral side: Secondary | ICD-10-CM | POA: Diagnosis not present

## 2021-02-02 DIAGNOSIS — S0991XA Unspecified injury of ear, initial encounter: Secondary | ICD-10-CM | POA: Diagnosis not present

## 2021-02-03 ENCOUNTER — Other Ambulatory Visit (HOSPITAL_COMMUNITY): Payer: Self-pay

## 2021-02-03 MED ORDER — BETAMETHASONE VALERATE 0.1 % EX OINT
TOPICAL_OINTMENT | CUTANEOUS | 1 refills | Status: AC
  Filled 2021-02-03: qty 15, 20d supply, fill #0

## 2021-02-20 DIAGNOSIS — Z0000 Encounter for general adult medical examination without abnormal findings: Secondary | ICD-10-CM | POA: Diagnosis not present

## 2021-03-25 ENCOUNTER — Other Ambulatory Visit (HOSPITAL_COMMUNITY): Payer: Self-pay

## 2021-03-25 MED FILL — Topiramate Cap ER 24HR 100 MG: ORAL | 90 days supply | Qty: 90 | Fill #3 | Status: AC

## 2021-04-30 ENCOUNTER — Other Ambulatory Visit (HOSPITAL_COMMUNITY): Payer: Self-pay

## 2021-05-01 ENCOUNTER — Other Ambulatory Visit (HOSPITAL_COMMUNITY): Payer: Self-pay

## 2021-05-04 ENCOUNTER — Other Ambulatory Visit (HOSPITAL_COMMUNITY): Payer: Self-pay

## 2021-05-05 ENCOUNTER — Other Ambulatory Visit (HOSPITAL_COMMUNITY): Payer: Self-pay

## 2021-05-08 ENCOUNTER — Other Ambulatory Visit (HOSPITAL_COMMUNITY): Payer: Self-pay

## 2021-05-08 MED ORDER — XARELTO 20 MG PO TABS
20.0000 mg | ORAL_TABLET | Freq: Every day | ORAL | 3 refills | Status: DC
  Filled 2021-05-08: qty 90, 90d supply, fill #0
  Filled 2021-08-02: qty 90, 90d supply, fill #1
  Filled 2021-11-01: qty 90, 90d supply, fill #2
  Filled 2022-02-08: qty 90, 90d supply, fill #3

## 2021-05-11 ENCOUNTER — Other Ambulatory Visit (HOSPITAL_COMMUNITY): Payer: Self-pay

## 2021-05-21 ENCOUNTER — Other Ambulatory Visit (HOSPITAL_COMMUNITY): Payer: Self-pay

## 2021-05-21 DIAGNOSIS — G40909 Epilepsy, unspecified, not intractable, without status epilepticus: Secondary | ICD-10-CM | POA: Diagnosis not present

## 2021-05-21 DIAGNOSIS — G43711 Chronic migraine without aura, intractable, with status migrainosus: Secondary | ICD-10-CM | POA: Diagnosis not present

## 2021-05-21 DIAGNOSIS — R4182 Altered mental status, unspecified: Secondary | ICD-10-CM | POA: Diagnosis not present

## 2021-05-21 DIAGNOSIS — G43719 Chronic migraine without aura, intractable, without status migrainosus: Secondary | ICD-10-CM | POA: Insufficient documentation

## 2021-05-21 MED ORDER — PREDNISONE 10 MG (21) PO TBPK
ORAL_TABLET | ORAL | 0 refills | Status: DC
  Filled 2021-05-21: qty 21, 6d supply, fill #0

## 2021-05-25 ENCOUNTER — Other Ambulatory Visit (HOSPITAL_COMMUNITY): Payer: Self-pay | Admitting: Nurse Practitioner

## 2021-05-25 DIAGNOSIS — R4182 Altered mental status, unspecified: Secondary | ICD-10-CM

## 2021-05-31 ENCOUNTER — Other Ambulatory Visit: Payer: Self-pay

## 2021-05-31 ENCOUNTER — Ambulatory Visit (HOSPITAL_COMMUNITY)
Admission: RE | Admit: 2021-05-31 | Discharge: 2021-05-31 | Disposition: A | Discharge status: Home / Self Care | Payer: 59 | Source: Ambulatory Visit | Attending: Nurse Practitioner | Admitting: Nurse Practitioner

## 2021-05-31 DIAGNOSIS — R4182 Altered mental status, unspecified: Secondary | ICD-10-CM | POA: Diagnosis not present

## 2021-05-31 DIAGNOSIS — R519 Headache, unspecified: Secondary | ICD-10-CM | POA: Diagnosis not present

## 2021-05-31 DIAGNOSIS — R42 Dizziness and giddiness: Secondary | ICD-10-CM | POA: Diagnosis not present

## 2021-05-31 DIAGNOSIS — R413 Other amnesia: Secondary | ICD-10-CM | POA: Diagnosis not present

## 2021-06-01 ENCOUNTER — Other Ambulatory Visit (HOSPITAL_COMMUNITY): Payer: Self-pay

## 2021-06-09 ENCOUNTER — Other Ambulatory Visit (HOSPITAL_COMMUNITY): Payer: Self-pay

## 2021-06-18 ENCOUNTER — Other Ambulatory Visit (HOSPITAL_COMMUNITY): Payer: Self-pay

## 2021-06-19 ENCOUNTER — Other Ambulatory Visit (HOSPITAL_COMMUNITY): Payer: Self-pay

## 2021-06-22 ENCOUNTER — Other Ambulatory Visit (HOSPITAL_COMMUNITY): Payer: Self-pay

## 2021-06-23 ENCOUNTER — Other Ambulatory Visit (HOSPITAL_COMMUNITY): Payer: Self-pay

## 2021-06-24 ENCOUNTER — Other Ambulatory Visit (HOSPITAL_COMMUNITY): Payer: Self-pay

## 2021-06-25 ENCOUNTER — Other Ambulatory Visit (HOSPITAL_COMMUNITY): Payer: Self-pay

## 2021-06-29 ENCOUNTER — Other Ambulatory Visit (HOSPITAL_COMMUNITY): Payer: Self-pay

## 2021-07-01 ENCOUNTER — Other Ambulatory Visit (HOSPITAL_COMMUNITY): Payer: Self-pay

## 2021-07-02 ENCOUNTER — Other Ambulatory Visit (HOSPITAL_COMMUNITY): Payer: Self-pay

## 2021-07-02 MED ORDER — TOPIRAMATE ER 100 MG PO CAP24
1.0000 | ORAL_CAPSULE | Freq: Every day | ORAL | 0 refills | Status: DC
  Filled 2021-07-02 – 2021-12-07 (×2): qty 90, 90d supply, fill #0

## 2021-07-09 DIAGNOSIS — Z6841 Body Mass Index (BMI) 40.0 and over, adult: Secondary | ICD-10-CM | POA: Diagnosis not present

## 2021-07-09 DIAGNOSIS — R87612 Low grade squamous intraepithelial lesion on cytologic smear of cervix (LGSIL): Secondary | ICD-10-CM | POA: Diagnosis not present

## 2021-07-09 DIAGNOSIS — Z01419 Encounter for gynecological examination (general) (routine) without abnormal findings: Secondary | ICD-10-CM | POA: Diagnosis not present

## 2021-08-04 ENCOUNTER — Other Ambulatory Visit (HOSPITAL_COMMUNITY): Payer: Self-pay

## 2021-08-05 ENCOUNTER — Other Ambulatory Visit (HOSPITAL_COMMUNITY): Payer: Self-pay

## 2021-08-06 ENCOUNTER — Other Ambulatory Visit (HOSPITAL_COMMUNITY): Payer: Self-pay

## 2021-08-06 MED ORDER — TRIAMTERENE-HCTZ 37.5-25 MG PO TABS
1.0000 | ORAL_TABLET | Freq: Every day | ORAL | 0 refills | Status: DC
  Filled 2021-08-06: qty 90, 90d supply, fill #0

## 2021-08-14 ENCOUNTER — Other Ambulatory Visit (HOSPITAL_COMMUNITY): Payer: Self-pay

## 2021-08-14 DIAGNOSIS — G43711 Chronic migraine without aura, intractable, with status migrainosus: Secondary | ICD-10-CM | POA: Diagnosis not present

## 2021-08-14 DIAGNOSIS — R4182 Altered mental status, unspecified: Secondary | ICD-10-CM | POA: Diagnosis not present

## 2021-08-14 DIAGNOSIS — G40209 Localization-related (focal) (partial) symptomatic epilepsy and epileptic syndromes with complex partial seizures, not intractable, without status epilepticus: Secondary | ICD-10-CM | POA: Diagnosis not present

## 2021-08-14 DIAGNOSIS — G43909 Migraine, unspecified, not intractable, without status migrainosus: Secondary | ICD-10-CM | POA: Diagnosis not present

## 2021-08-14 MED ORDER — TOPIRAMATE ER 100 MG PO CAP24
1.0000 | ORAL_CAPSULE | Freq: Every day | ORAL | 3 refills | Status: DC
  Filled 2021-08-14 – 2021-09-30 (×2): qty 90, 90d supply, fill #0
  Filled 2021-12-11: qty 90, 90d supply, fill #1

## 2021-09-04 DIAGNOSIS — M1712 Unilateral primary osteoarthritis, left knee: Secondary | ICD-10-CM | POA: Diagnosis not present

## 2021-09-04 DIAGNOSIS — M25562 Pain in left knee: Secondary | ICD-10-CM | POA: Diagnosis not present

## 2021-09-04 DIAGNOSIS — G8929 Other chronic pain: Secondary | ICD-10-CM | POA: Diagnosis not present

## 2021-09-16 DIAGNOSIS — M1712 Unilateral primary osteoarthritis, left knee: Secondary | ICD-10-CM | POA: Diagnosis not present

## 2021-09-24 DIAGNOSIS — F411 Generalized anxiety disorder: Secondary | ICD-10-CM | POA: Diagnosis not present

## 2021-09-24 DIAGNOSIS — F331 Major depressive disorder, recurrent, moderate: Secondary | ICD-10-CM | POA: Diagnosis not present

## 2021-09-30 ENCOUNTER — Other Ambulatory Visit (HOSPITAL_COMMUNITY): Payer: Self-pay

## 2021-10-01 ENCOUNTER — Other Ambulatory Visit (HOSPITAL_COMMUNITY): Payer: Self-pay

## 2021-10-07 DIAGNOSIS — Z1231 Encounter for screening mammogram for malignant neoplasm of breast: Secondary | ICD-10-CM | POA: Diagnosis not present

## 2021-10-27 ENCOUNTER — Other Ambulatory Visit (HOSPITAL_COMMUNITY): Payer: Self-pay

## 2021-10-27 DIAGNOSIS — S8000XA Contusion of unspecified knee, initial encounter: Secondary | ICD-10-CM | POA: Diagnosis not present

## 2021-10-27 DIAGNOSIS — S8010XA Contusion of unspecified lower leg, initial encounter: Secondary | ICD-10-CM | POA: Diagnosis not present

## 2021-10-27 DIAGNOSIS — S8982XA Other specified injuries of left lower leg, initial encounter: Secondary | ICD-10-CM | POA: Diagnosis not present

## 2021-10-27 DIAGNOSIS — M1712 Unilateral primary osteoarthritis, left knee: Secondary | ICD-10-CM | POA: Diagnosis not present

## 2021-10-27 MED ORDER — TRAMADOL HCL 50 MG PO TABS
50.0000 mg | ORAL_TABLET | Freq: Four times a day (QID) | ORAL | 0 refills | Status: DC | PRN
  Filled 2021-10-27: qty 20, 5d supply, fill #0

## 2021-10-27 MED ORDER — METHOCARBAMOL 500 MG PO TABS
500.0000 mg | ORAL_TABLET | Freq: Four times a day (QID) | ORAL | 1 refills | Status: DC | PRN
  Filled 2021-10-27: qty 40, 10d supply, fill #0

## 2021-11-01 ENCOUNTER — Other Ambulatory Visit (HOSPITAL_COMMUNITY): Payer: Self-pay

## 2021-11-02 ENCOUNTER — Other Ambulatory Visit (HOSPITAL_COMMUNITY): Payer: Self-pay

## 2021-11-03 ENCOUNTER — Other Ambulatory Visit (HOSPITAL_COMMUNITY): Payer: Self-pay

## 2021-11-03 MED ORDER — TRIAMTERENE-HCTZ 37.5-25 MG PO TABS
1.0000 | ORAL_TABLET | Freq: Every day | ORAL | 0 refills | Status: DC
  Filled 2021-11-03: qty 30, 30d supply, fill #0

## 2021-11-13 DIAGNOSIS — M1712 Unilateral primary osteoarthritis, left knee: Secondary | ICD-10-CM | POA: Diagnosis not present

## 2021-11-17 ENCOUNTER — Other Ambulatory Visit (HOSPITAL_COMMUNITY): Payer: Self-pay

## 2021-11-17 MED ORDER — TRAMADOL HCL 50 MG PO TABS
50.0000 mg | ORAL_TABLET | Freq: Three times a day (TID) | ORAL | 0 refills | Status: DC | PRN
  Filled 2021-11-17: qty 15, 5d supply, fill #0

## 2021-11-26 ENCOUNTER — Other Ambulatory Visit (HOSPITAL_COMMUNITY): Payer: Self-pay

## 2021-11-26 DIAGNOSIS — I10 Essential (primary) hypertension: Secondary | ICD-10-CM | POA: Diagnosis not present

## 2021-11-26 DIAGNOSIS — Z0000 Encounter for general adult medical examination without abnormal findings: Secondary | ICD-10-CM | POA: Diagnosis not present

## 2021-11-26 DIAGNOSIS — F341 Dysthymic disorder: Secondary | ICD-10-CM | POA: Diagnosis not present

## 2021-11-26 DIAGNOSIS — R5383 Other fatigue: Secondary | ICD-10-CM | POA: Diagnosis not present

## 2021-11-26 MED ORDER — TRIAMTERENE-HCTZ 37.5-25 MG PO TABS
1.0000 | ORAL_TABLET | Freq: Every day | ORAL | 3 refills | Status: DC
  Filled 2021-11-26 – 2021-12-04 (×2): qty 90, 90d supply, fill #0
  Filled 2022-03-11: qty 90, 90d supply, fill #1
  Filled 2022-06-08: qty 90, 90d supply, fill #2
  Filled 2022-09-07: qty 90, 90d supply, fill #3

## 2021-11-30 ENCOUNTER — Other Ambulatory Visit (HOSPITAL_COMMUNITY): Payer: Self-pay

## 2021-11-30 MED ORDER — POTASSIUM CHLORIDE CRYS ER 20 MEQ PO TBCR
20.0000 meq | EXTENDED_RELEASE_TABLET | Freq: Every day | ORAL | 1 refills | Status: AC
  Filled 2021-11-30: qty 14, 14d supply, fill #0

## 2021-12-01 ENCOUNTER — Other Ambulatory Visit (HOSPITAL_COMMUNITY): Payer: Self-pay

## 2021-12-04 ENCOUNTER — Other Ambulatory Visit (HOSPITAL_COMMUNITY): Payer: Self-pay

## 2021-12-07 ENCOUNTER — Other Ambulatory Visit (HOSPITAL_COMMUNITY): Payer: Self-pay

## 2021-12-10 DIAGNOSIS — D500 Iron deficiency anemia secondary to blood loss (chronic): Secondary | ICD-10-CM | POA: Diagnosis not present

## 2021-12-11 ENCOUNTER — Other Ambulatory Visit (HOSPITAL_COMMUNITY): Payer: Self-pay

## 2021-12-30 DIAGNOSIS — D500 Iron deficiency anemia secondary to blood loss (chronic): Secondary | ICD-10-CM | POA: Diagnosis not present

## 2022-01-13 DIAGNOSIS — R87610 Atypical squamous cells of undetermined significance on cytologic smear of cervix (ASC-US): Secondary | ICD-10-CM | POA: Diagnosis not present

## 2022-01-13 DIAGNOSIS — R87612 Low grade squamous intraepithelial lesion on cytologic smear of cervix (LGSIL): Secondary | ICD-10-CM | POA: Diagnosis not present

## 2022-01-19 ENCOUNTER — Other Ambulatory Visit (HOSPITAL_COMMUNITY): Payer: Self-pay

## 2022-01-19 MED ORDER — FLUOXETINE HCL 40 MG PO CAPS
40.0000 mg | ORAL_CAPSULE | Freq: Every day | ORAL | 3 refills | Status: DC
  Filled 2022-01-19: qty 90, 90d supply, fill #0

## 2022-01-22 DIAGNOSIS — E876 Hypokalemia: Secondary | ICD-10-CM | POA: Diagnosis not present

## 2022-01-26 ENCOUNTER — Other Ambulatory Visit (HOSPITAL_COMMUNITY): Payer: Self-pay

## 2022-01-26 MED ORDER — FLUOXETINE HCL 40 MG PO CAPS
80.0000 mg | ORAL_CAPSULE | Freq: Every day | ORAL | 3 refills | Status: DC
  Filled 2022-01-26 – 2022-03-11 (×2): qty 90, 45d supply, fill #0
  Filled 2022-10-28: qty 60, 30d supply, fill #0
  Filled 2022-11-29: qty 60, 30d supply, fill #1

## 2022-02-08 ENCOUNTER — Other Ambulatory Visit (HOSPITAL_COMMUNITY): Payer: Self-pay

## 2022-02-17 ENCOUNTER — Other Ambulatory Visit (HOSPITAL_COMMUNITY): Payer: Self-pay

## 2022-02-17 DIAGNOSIS — G40909 Epilepsy, unspecified, not intractable, without status epilepticus: Secondary | ICD-10-CM | POA: Diagnosis not present

## 2022-02-17 DIAGNOSIS — R4182 Altered mental status, unspecified: Secondary | ICD-10-CM | POA: Diagnosis not present

## 2022-02-17 DIAGNOSIS — G43711 Chronic migraine without aura, intractable, with status migrainosus: Secondary | ICD-10-CM | POA: Diagnosis not present

## 2022-02-17 MED ORDER — TOPIRAMATE ER 100 MG PO CAP24
1.0000 | ORAL_CAPSULE | Freq: Every day | ORAL | 2 refills | Status: DC
  Filled 2022-02-17 – 2022-04-19 (×2): qty 90, 90d supply, fill #0
  Filled 2022-07-13: qty 90, 90d supply, fill #1
  Filled 2022-10-12: qty 90, 90d supply, fill #0

## 2022-03-11 ENCOUNTER — Other Ambulatory Visit (HOSPITAL_COMMUNITY): Payer: Self-pay

## 2022-04-19 ENCOUNTER — Other Ambulatory Visit (HOSPITAL_COMMUNITY): Payer: Self-pay

## 2022-04-20 ENCOUNTER — Other Ambulatory Visit (HOSPITAL_COMMUNITY): Payer: Self-pay

## 2022-04-20 MED ORDER — FLUOXETINE HCL 40 MG PO CAPS
80.0000 mg | ORAL_CAPSULE | Freq: Every day | ORAL | 1 refills | Status: DC
  Filled 2022-04-20: qty 180, 90d supply, fill #0
  Filled 2022-07-13: qty 180, 90d supply, fill #1

## 2022-04-26 ENCOUNTER — Other Ambulatory Visit (HOSPITAL_COMMUNITY): Payer: Self-pay

## 2022-04-28 ENCOUNTER — Other Ambulatory Visit (HOSPITAL_COMMUNITY): Payer: Self-pay

## 2022-05-12 ENCOUNTER — Other Ambulatory Visit (HOSPITAL_COMMUNITY): Payer: Self-pay

## 2022-05-13 ENCOUNTER — Other Ambulatory Visit (HOSPITAL_COMMUNITY): Payer: Self-pay

## 2022-05-13 MED ORDER — XARELTO 20 MG PO TABS
20.0000 mg | ORAL_TABLET | Freq: Every day | ORAL | 3 refills | Status: DC
  Filled 2022-05-13: qty 90, 90d supply, fill #0
  Filled 2022-08-04: qty 90, 90d supply, fill #1
  Filled 2022-10-28: qty 90, 90d supply, fill #0
  Filled 2023-02-01: qty 90, 90d supply, fill #1

## 2022-05-27 ENCOUNTER — Other Ambulatory Visit (HOSPITAL_COMMUNITY): Payer: Self-pay

## 2022-05-27 DIAGNOSIS — Z6841 Body Mass Index (BMI) 40.0 and over, adult: Secondary | ICD-10-CM | POA: Diagnosis not present

## 2022-05-27 DIAGNOSIS — E7800 Pure hypercholesterolemia, unspecified: Secondary | ICD-10-CM | POA: Diagnosis not present

## 2022-05-27 DIAGNOSIS — I10 Essential (primary) hypertension: Secondary | ICD-10-CM | POA: Diagnosis not present

## 2022-05-27 DIAGNOSIS — J309 Allergic rhinitis, unspecified: Secondary | ICD-10-CM | POA: Diagnosis not present

## 2022-05-27 DIAGNOSIS — R7303 Prediabetes: Secondary | ICD-10-CM | POA: Diagnosis not present

## 2022-05-27 DIAGNOSIS — H579 Unspecified disorder of eye and adnexa: Secondary | ICD-10-CM | POA: Diagnosis not present

## 2022-05-27 MED ORDER — OLOPATADINE HCL 0.2 % OP SOLN
1.0000 [drp] | Freq: Two times a day (BID) | OPHTHALMIC | 11 refills | Status: AC
  Filled 2022-05-27: qty 3, 30d supply, fill #0
  Filled 2022-05-31: qty 3, 15d supply, fill #0

## 2022-05-27 MED ORDER — OZEMPIC (0.25 OR 0.5 MG/DOSE) 2 MG/3ML ~~LOC~~ SOPN
0.5000 mg | PEN_INJECTOR | SUBCUTANEOUS | 3 refills | Status: DC
  Filled 2022-05-27: qty 3, 30d supply, fill #0
  Filled 2022-05-31 – 2022-06-08 (×2): qty 3, 28d supply, fill #0

## 2022-05-31 ENCOUNTER — Other Ambulatory Visit (HOSPITAL_COMMUNITY): Payer: Self-pay

## 2022-06-01 ENCOUNTER — Other Ambulatory Visit (HOSPITAL_COMMUNITY): Payer: Self-pay

## 2022-06-08 ENCOUNTER — Other Ambulatory Visit (HOSPITAL_COMMUNITY): Payer: Self-pay

## 2022-06-09 ENCOUNTER — Other Ambulatory Visit: Payer: Self-pay

## 2022-06-09 ENCOUNTER — Other Ambulatory Visit (HOSPITAL_COMMUNITY): Payer: Self-pay

## 2022-06-10 ENCOUNTER — Other Ambulatory Visit (HOSPITAL_COMMUNITY): Payer: Self-pay

## 2022-06-10 DIAGNOSIS — D500 Iron deficiency anemia secondary to blood loss (chronic): Secondary | ICD-10-CM | POA: Diagnosis not present

## 2022-06-10 MED ORDER — OZEMPIC (0.25 OR 0.5 MG/DOSE) 2 MG/3ML ~~LOC~~ SOPN
2.0000 mg | PEN_INJECTOR | SUBCUTANEOUS | 3 refills | Status: DC
  Filled 2022-06-10: qty 3, 30d supply, fill #0

## 2022-06-10 MED ORDER — WEGOVY 0.25 MG/0.5ML ~~LOC~~ SOAJ
0.2500 mg | SUBCUTANEOUS | 11 refills | Status: DC
  Filled 2022-06-10 – 2022-07-02 (×2): qty 2, 28d supply, fill #0

## 2022-06-11 DIAGNOSIS — D500 Iron deficiency anemia secondary to blood loss (chronic): Secondary | ICD-10-CM | POA: Diagnosis not present

## 2022-06-14 ENCOUNTER — Other Ambulatory Visit (HOSPITAL_COMMUNITY): Payer: Self-pay

## 2022-06-14 MED ORDER — TOPIRAMATE ER 100 MG PO CAP24
1.0000 | ORAL_CAPSULE | Freq: Every day | ORAL | 1 refills | Status: DC
  Filled 2022-06-14 – 2023-04-18 (×2): qty 90, 90d supply, fill #0

## 2022-06-17 DIAGNOSIS — E7800 Pure hypercholesterolemia, unspecified: Secondary | ICD-10-CM | POA: Diagnosis not present

## 2022-06-17 DIAGNOSIS — R7303 Prediabetes: Secondary | ICD-10-CM | POA: Diagnosis not present

## 2022-06-17 DIAGNOSIS — Z1321 Encounter for screening for nutritional disorder: Secondary | ICD-10-CM | POA: Diagnosis not present

## 2022-06-17 DIAGNOSIS — Z1329 Encounter for screening for other suspected endocrine disorder: Secondary | ICD-10-CM | POA: Diagnosis not present

## 2022-06-17 DIAGNOSIS — Z130 Encounter for screening for diseases of the blood and blood-forming organs and certain disorders involving the immune mechanism: Secondary | ICD-10-CM | POA: Diagnosis not present

## 2022-06-17 DIAGNOSIS — Z13228 Encounter for screening for other metabolic disorders: Secondary | ICD-10-CM | POA: Diagnosis not present

## 2022-06-17 DIAGNOSIS — Z0000 Encounter for general adult medical examination without abnormal findings: Secondary | ICD-10-CM | POA: Diagnosis not present

## 2022-06-18 DIAGNOSIS — Z1321 Encounter for screening for nutritional disorder: Secondary | ICD-10-CM | POA: Diagnosis not present

## 2022-06-18 DIAGNOSIS — Z13228 Encounter for screening for other metabolic disorders: Secondary | ICD-10-CM | POA: Diagnosis not present

## 2022-06-18 DIAGNOSIS — E7800 Pure hypercholesterolemia, unspecified: Secondary | ICD-10-CM | POA: Diagnosis not present

## 2022-06-18 DIAGNOSIS — Z130 Encounter for screening for diseases of the blood and blood-forming organs and certain disorders involving the immune mechanism: Secondary | ICD-10-CM | POA: Diagnosis not present

## 2022-06-18 DIAGNOSIS — R7303 Prediabetes: Secondary | ICD-10-CM | POA: Diagnosis not present

## 2022-06-18 DIAGNOSIS — Z1329 Encounter for screening for other suspected endocrine disorder: Secondary | ICD-10-CM | POA: Diagnosis not present

## 2022-06-21 ENCOUNTER — Other Ambulatory Visit (HOSPITAL_COMMUNITY): Payer: Self-pay

## 2022-06-29 ENCOUNTER — Other Ambulatory Visit (HOSPITAL_COMMUNITY): Payer: Self-pay

## 2022-06-29 ENCOUNTER — Encounter (HOSPITAL_COMMUNITY): Payer: Self-pay

## 2022-07-02 ENCOUNTER — Encounter (HOSPITAL_COMMUNITY): Payer: Self-pay

## 2022-07-02 ENCOUNTER — Other Ambulatory Visit (HOSPITAL_COMMUNITY): Payer: Self-pay

## 2022-07-05 ENCOUNTER — Other Ambulatory Visit (HOSPITAL_COMMUNITY): Payer: Self-pay

## 2022-07-13 ENCOUNTER — Other Ambulatory Visit: Payer: Self-pay

## 2022-07-23 IMAGING — MR MR HEAD W/O CM
11 of 12 series · 43 of 48 positions shown · non-contrast
Comparison: No pertinent prior exams available for comparison.

CLINICAL DATA: Altered mental status, unspecified altered mental
status type. Additional history provided by scanning technologist:
Headache, dizziness, memory loss.

EXAM:
MRI HEAD WITHOUT CONTRAST
TECHNIQUE: Multiplanar, multiecho pulse sequences of the brain and surrounding
structures were obtained without intravenous contrast.

[Series 5: DWI · axial · 3.0mm · 0.88mm/px · z∈[-127,+29]mm · 8 of 106 slices shown (1 of 4)]
[im 1/106]
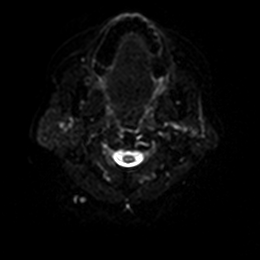
[im 16/106]
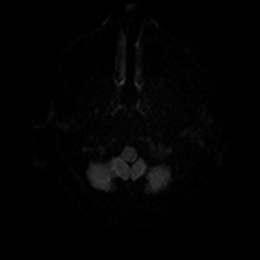
[im 31/106]
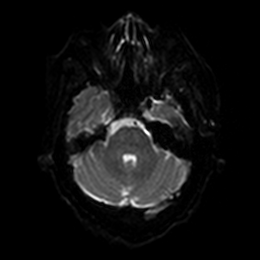
[im 46/106]
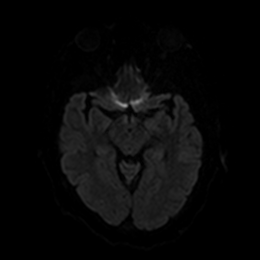
[im 61/106]
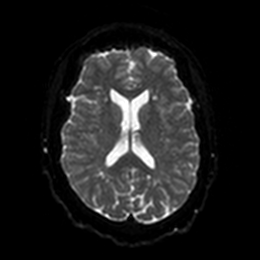
[im 76/106]
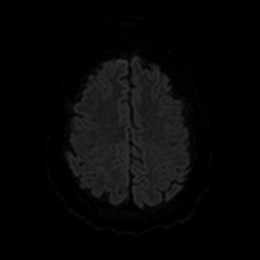
[im 91/106]
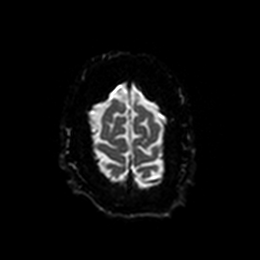
[im 106/106]
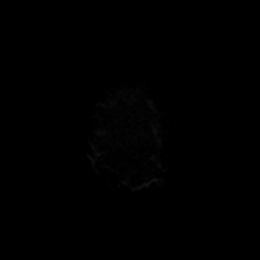

[Series 6: DWI · axial · 3.0mm · 0.88mm/px · z∈[-127,+29]mm · 4 of 53 slices shown (2 of 4)]
[im 1/53]
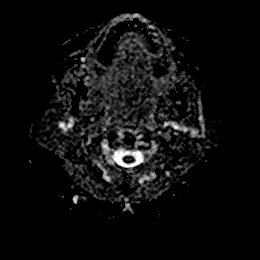
[im 18/53]
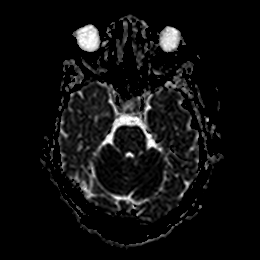
[im 35/53]
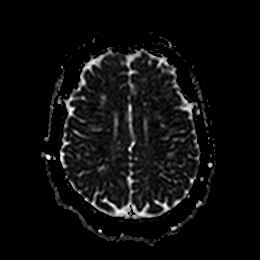
[im 53/53]
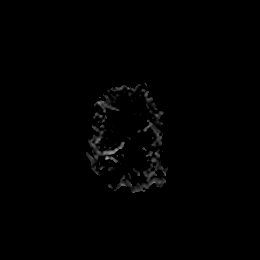

[Series 7: DWI · coronal · 4.0mm · 0.88mm/px · 5 of 68 slices shown (3 of 4)]
[im 1/68]
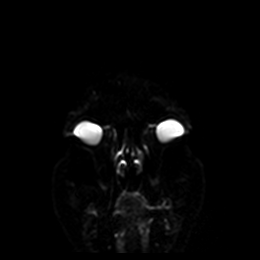
[im 17/68]
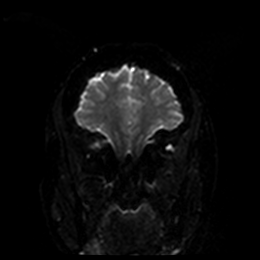
[im 34/68]
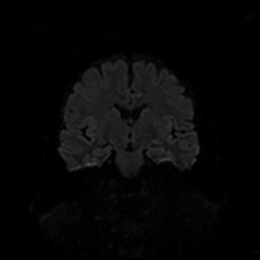
[im 51/68]
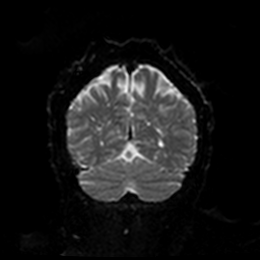
[im 68/68]
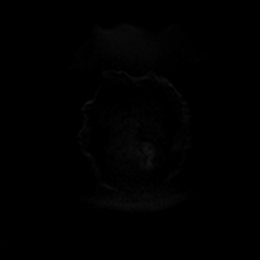

[Series 8: DWI · coronal · 4.0mm · 0.88mm/px · 3 of 34 slices shown (4 of 4)]
[im 1/34]
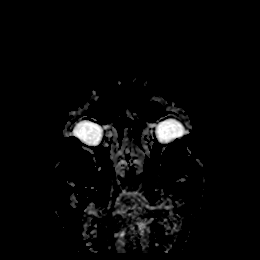
[im 17/34]
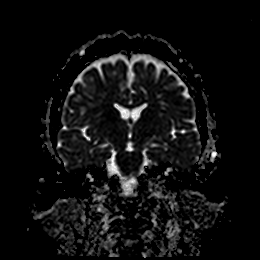
[im 34/34]
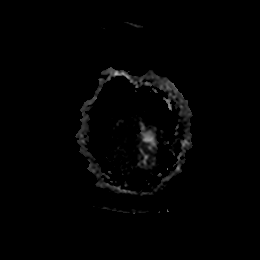

[Series 9: T1 · sagittal · 5.0mm · 0.75mm/px · 2 of 25 slices shown]
[im 1/25]
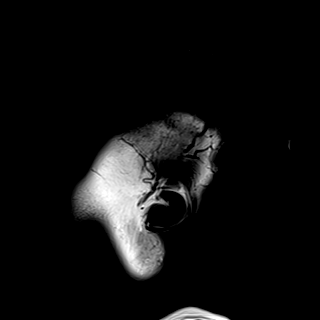
[im 25/25]
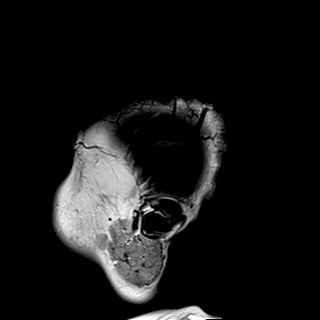

[Series 10: T2 · axial · 5.0mm · 0.72mm/px · z∈[-134,+22]mm · 2 of 27 slices shown (1 of 2)]
[im 1/27]
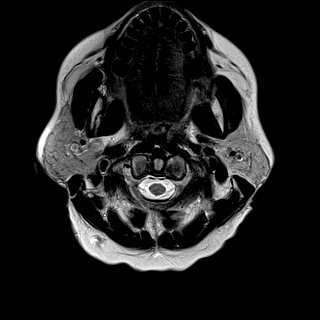
[im 27/27]
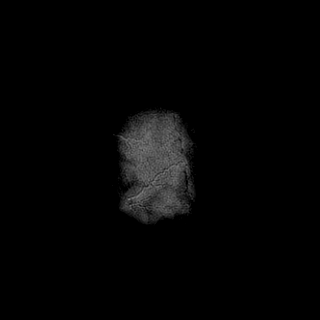

[Series 11: FLAIR · axial · 5.0mm · 0.45mm/px · z∈[-135,+21]mm · 2 of 27 slices shown]
[im 1/27]
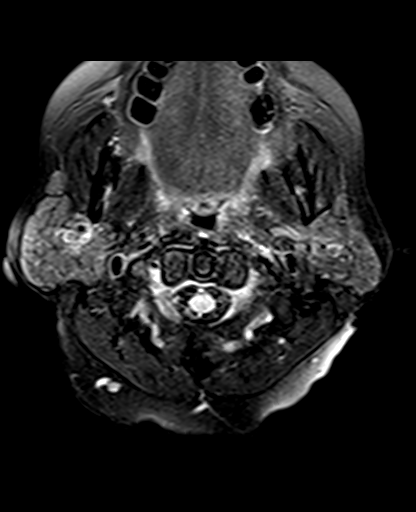
[im 27/27]
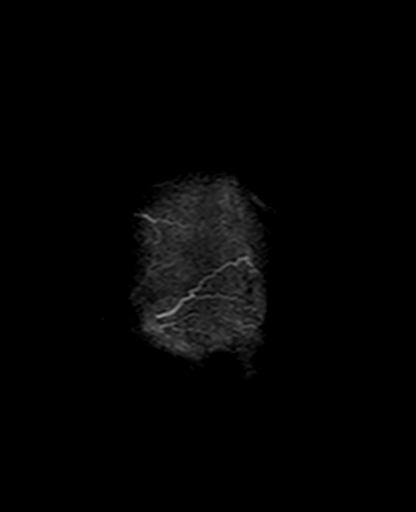

[Series 12: mag_images · axial · 3.0mm · 0.90mm/px · z∈[-139,+38]mm · 5 of 60 slices shown]
[im 1/60]
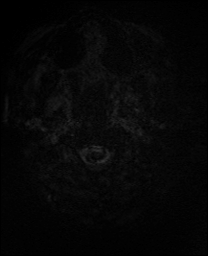
[im 15/60]
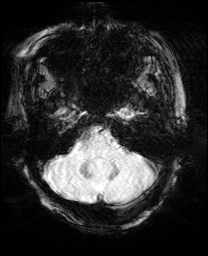
[im 30/60]
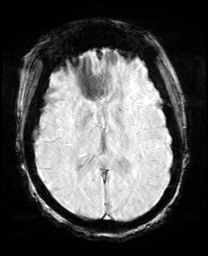
[im 45/60]
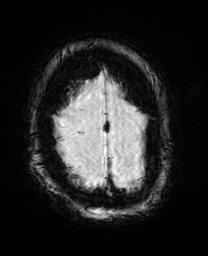
[im 60/60]
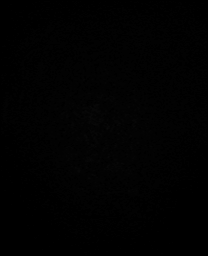

[Series 13: pha_images · axial · 3.0mm · 0.90mm/px · z∈[-139,+35]mm · 5 of 59 slices shown]
[im 1/59]
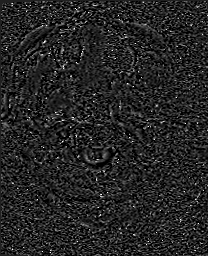
[im 15/59]
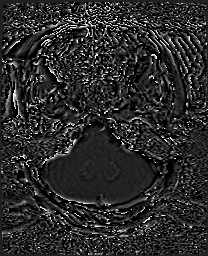
[im 30/59]
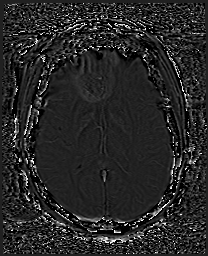
[im 44/59]
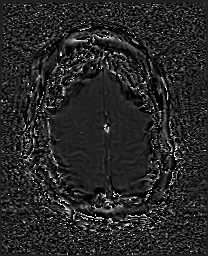
[im 59/59]
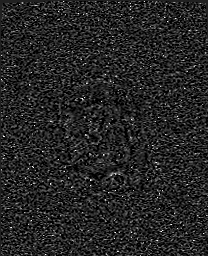

[Series 14: swi_images · axial · 3.0mm · 0.90mm/px · z∈[-139,+38]mm · 5 of 60 slices shown]
[im 1/60]
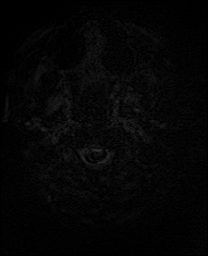
[im 15/60]
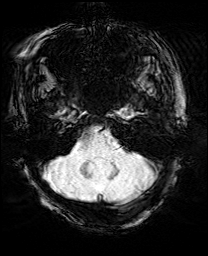
[im 30/60]
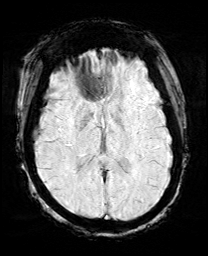
[im 45/60]
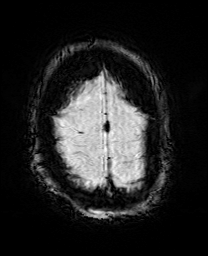
[im 60/60]
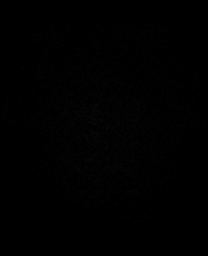

[Series 17: T2 · coronal · 5.0mm · 0.34mm/px · 2 of 30 slices shown (2 of 2)]
[im 1/30]
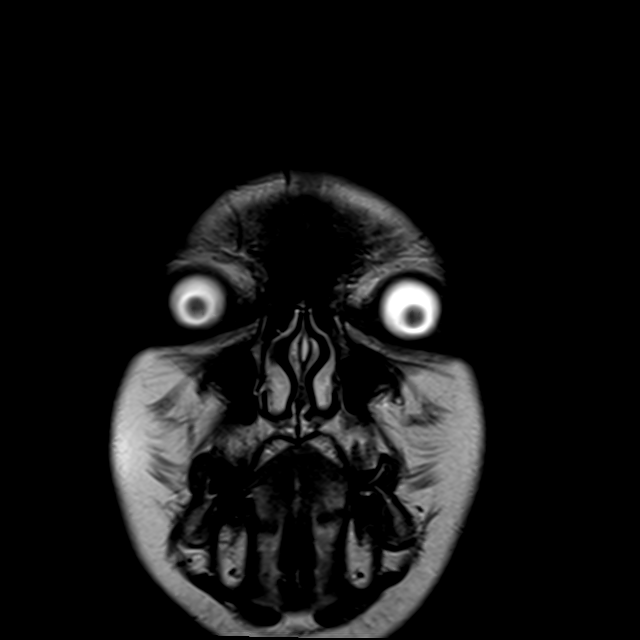
[im 30/30]
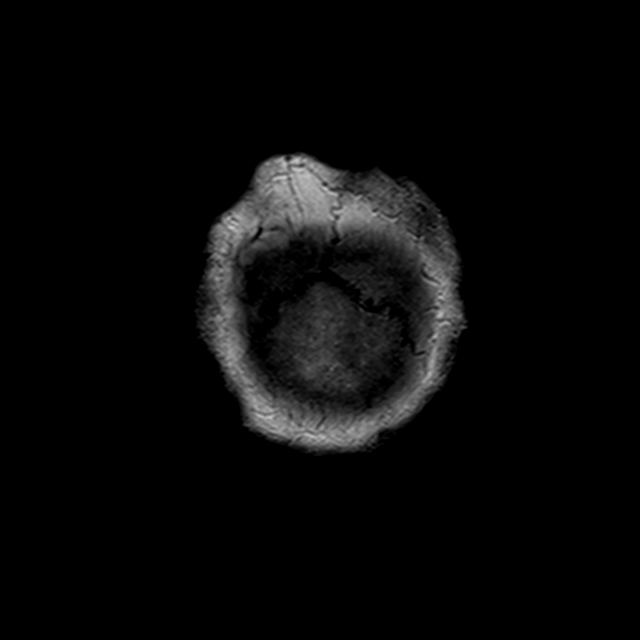

[43 of 48 positions shown; findings below may reference images not displayed]

FINDINGS: Brain:

No age advanced or lobar predominant atrophy.

Multifocal T2 FLAIR hyperintense signal abnormality within the
cerebral white matter, overall mild but advanced for age.

Superimposed prominent perivascular spaces within the supratentorial
brain

Partially empty sella turcica.

There is no acute infarct.

No evidence of an intracranial mass.

No chronic intracranial blood products.

No extra-axial fluid collection.

No midline shift.

Vascular: Maintained flow voids within the proximal large arterial
vessels. Dominant right vertebral artery.

Skull and upper cervical spine: No focal suspicious marrow lesion.

Sinuses/Orbits: Visualized orbits show no acute finding. No
significant paranasal sinus disease.
IMPRESSION: No evidence of acute intracranial abnormality.

Multifocal T2 FLAIR hyperintense signal abnormality within the
cerebral white matter, overall mild but advanced for age. These
signal changes are nonspecific and differential considerations
include chronic small vessel ischemic disease, sequela of chronic
migraine headaches, sequela of a prior infectious/inflammatory
process, vasculitis or sequela of a demyelinating process (such as
multiple sclerosis), among others. No lesions specifically suggest
demyelinating disease.

Partially empty sella turcica. While this finding often reflects
incidental anatomic variation, it can also be associated with
idiopathic intracranial hypertension (pseudotumor cerebri).

## 2022-08-05 ENCOUNTER — Other Ambulatory Visit (HOSPITAL_COMMUNITY): Payer: Self-pay

## 2022-08-12 ENCOUNTER — Other Ambulatory Visit (HOSPITAL_COMMUNITY): Payer: Self-pay

## 2022-10-12 ENCOUNTER — Other Ambulatory Visit (HOSPITAL_COMMUNITY): Payer: Self-pay

## 2022-10-13 ENCOUNTER — Other Ambulatory Visit: Payer: Self-pay

## 2022-10-21 ENCOUNTER — Other Ambulatory Visit (HOSPITAL_COMMUNITY): Payer: Self-pay

## 2022-10-21 DIAGNOSIS — G43711 Chronic migraine without aura, intractable, with status migrainosus: Secondary | ICD-10-CM | POA: Diagnosis not present

## 2022-10-21 DIAGNOSIS — G40909 Epilepsy, unspecified, not intractable, without status epilepticus: Secondary | ICD-10-CM | POA: Diagnosis not present

## 2022-10-21 DIAGNOSIS — R4182 Altered mental status, unspecified: Secondary | ICD-10-CM | POA: Diagnosis not present

## 2022-10-21 DIAGNOSIS — G43909 Migraine, unspecified, not intractable, without status migrainosus: Secondary | ICD-10-CM | POA: Diagnosis not present

## 2022-10-21 DIAGNOSIS — G40209 Localization-related (focal) (partial) symptomatic epilepsy and epileptic syndromes with complex partial seizures, not intractable, without status epilepticus: Secondary | ICD-10-CM | POA: Diagnosis not present

## 2022-10-21 MED ORDER — TOPIRAMATE ER 100 MG PO CAP24
100.0000 mg | ORAL_CAPSULE | Freq: Every day | ORAL | 2 refills | Status: AC
  Filled 2023-01-17: qty 90, 90d supply, fill #0
  Filled 2023-06-29: qty 90, 90d supply, fill #1
  Filled 2023-10-18: qty 90, 90d supply, fill #2

## 2022-10-28 ENCOUNTER — Other Ambulatory Visit (HOSPITAL_COMMUNITY): Payer: Self-pay

## 2022-10-28 ENCOUNTER — Other Ambulatory Visit: Payer: Self-pay

## 2022-10-28 MED ORDER — FLUOXETINE HCL 40 MG PO CAPS
80.0000 mg | ORAL_CAPSULE | Freq: Every day | ORAL | 1 refills | Status: DC
  Filled 2022-10-28 – 2022-12-01 (×2): qty 180, 90d supply, fill #0
  Filled 2023-02-21 – 2023-02-22 (×2): qty 180, 90d supply, fill #1

## 2022-11-29 ENCOUNTER — Other Ambulatory Visit (HOSPITAL_COMMUNITY): Payer: Self-pay

## 2022-11-30 ENCOUNTER — Other Ambulatory Visit (HOSPITAL_COMMUNITY): Payer: Self-pay

## 2022-11-30 MED ORDER — FLUOXETINE HCL 40 MG PO CAPS
80.0000 mg | ORAL_CAPSULE | Freq: Every day | ORAL | 1 refills | Status: DC
  Filled 2022-11-30: qty 180, 180d supply, fill #0
  Filled 2022-12-01 – 2023-05-27 (×2): qty 180, 90d supply, fill #0

## 2022-11-30 MED ORDER — TRIAMTERENE-HCTZ 37.5-25 MG PO TABS
1.0000 | ORAL_TABLET | Freq: Every day | ORAL | 1 refills | Status: DC
  Filled 2022-11-30: qty 90, 90d supply, fill #0
  Filled 2023-02-21: qty 90, 90d supply, fill #1

## 2022-12-01 ENCOUNTER — Other Ambulatory Visit (HOSPITAL_COMMUNITY): Payer: Self-pay

## 2022-12-06 ENCOUNTER — Other Ambulatory Visit (HOSPITAL_COMMUNITY): Payer: Self-pay

## 2022-12-29 ENCOUNTER — Encounter (HOSPITAL_COMMUNITY): Payer: Self-pay | Admitting: Emergency Medicine

## 2022-12-29 ENCOUNTER — Ambulatory Visit (INDEPENDENT_AMBULATORY_CARE_PROVIDER_SITE_OTHER): Payer: Commercial Managed Care - PPO

## 2022-12-29 ENCOUNTER — Ambulatory Visit (HOSPITAL_COMMUNITY)
Admission: EM | Admit: 2022-12-29 | Discharge: 2022-12-29 | Disposition: A | Discharge status: Home / Self Care | Payer: Commercial Managed Care - PPO | Attending: Emergency Medicine | Admitting: Emergency Medicine

## 2022-12-29 DIAGNOSIS — M19072 Primary osteoarthritis, left ankle and foot: Secondary | ICD-10-CM | POA: Diagnosis not present

## 2022-12-29 DIAGNOSIS — M85872 Other specified disorders of bone density and structure, left ankle and foot: Secondary | ICD-10-CM | POA: Diagnosis not present

## 2022-12-29 DIAGNOSIS — M79672 Pain in left foot: Secondary | ICD-10-CM

## 2022-12-29 NOTE — ED Provider Notes (Signed)
[facility]    CSN: [id number] Arrival date & time: 12/29/22  1154     History   Chief Complaint Chief Complaint  Patient presents with   Foot Pain    HPI Courtney Delacruz is a 62 y.o. female.  Reports 3 weeks ago she slipped and fell on her left foot Uncomfortable on and off since then Seemed worse today, having pain and swelling on dorsal foot. She was on her feet the last few days and wearing dress shoes Elevates sometimes, every now and then takes ibuprofen   Hx OA left knee  History reviewed. No pertinent past medical history.  There are no problems to display for this patient.   History reviewed. No pertinent surgical history.  OB History   No obstetric history on file.      Home Medications    Prior to Admission medications   Medication Sig Start Date End Date Taking? Authorizing Provider  betamethasone valerate ointment (VALISONE) 0.1 % Apply small amount to external ear canals at bedtime 02/03/21     escitalopram (LEXAPRO) 20 MG tablet Take 20 mg by mouth daily.    [provider]  famotidine (PEPCID) 40 MG tablet Take 1 tablet (40 mg total) by mouth at bedtime as needed for heartburn. 01/28/21     FLUoxetine (PROZAC) 40 MG capsule Take 2 capsules (80 mg total) by mouth daily. 01/26/22     FLUoxetine (PROZAC) 40 MG capsule Take 2 capsules (80 mg total) by mouth daily. 10/28/22     FLUoxetine (PROZAC) 40 MG capsule Take 1 capsule (40 mg total) by mouth daily. 11/30/22     Olopatadine HCl 0.2 % SOLN Place 1 drop into both eyes 2 (two) times daily. 05/27/22     potassium chloride (KLOR-CON M) 10 MEQ tablet Take 1 tablet (10 mEq total) by mouth daily. 01/26/21     potassium chloride SA (KLOR-CON M) 20 MEQ tablet Take 1 tablet (20 mEq total) by mouth daily. 11/29/21     rivaroxaban (XARELTO) 20 MG TABS tablet Take 20 mg by mouth daily with supper.    [provider]  rivaroxaban (XARELTO) 20 MG TABS tablet TAKE 1 TABLET BY MOUTH ONCE  DAILY. 04/03/20 04/03/21    rivaroxaban (XARELTO) 20 MG TABS tablet TAKE 1 TABLET BY MOUTH ONCE DAILY. 09/24/19 09/23/20    rivaroxaban (XARELTO) 20 MG TABS tablet Take 1 tablet (20 mg total) by mouth daily. 05/08/21     rivaroxaban (XARELTO) 20 MG TABS tablet Take 1 tablet (20 mg total) by mouth daily. 05/13/22     Topiramate ER (TROKENDI XR) 100 MG CP24 TAKE 1 CAPSULE BY MOUTH ONCE DAILY. 04/17/20 06/28/21  Briant Cedar, MD  Topiramate ER (TROKENDI XR) 100 MG CP24 TAKE 1 CAPSULE BY MOUTH ONCE A DAY 05/14/19 05/13/20  Spero Geralds, NP  Topiramate ER (TROKENDI XR) 100 MG CP24 Take 1 capsule by mouth once a day 07/02/20     Topiramate ER (TROKENDI XR) 100 MG CP24 Take 1 capsule every day 07/02/21     Topiramate ER (TROKENDI XR) 100 MG CP24 Take 1 capsule by mouth daily. 08/14/21     Topiramate ER (TROKENDI XR) 100 MG CP24 Take 1 capsule by mouth once a day 06/14/22     Topiramate ER (TROKENDI XR) 100 MG CP24 Take 1 capsule (100 mg total) by mouth daily. 10/21/22     triamterene-hydrochlorothiazide (MAXZIDE-25) 37.5-25 MG tablet Take 1 tablet by mouth daily. 11/30/22  Family History History reviewed. No pertinent family history.  Social History Social History   Tobacco Use   Smoking status: Never   Smokeless tobacco: Never  Substance Use Topics   Alcohol use: Never   Drug use: Never     Allergies   Clarithromycin and Aspirin   Review of Systems Review of Systems Per HPI  Physical Exam Triage Vital Signs ED Triage Vitals  Encounter Vitals Group     BP 12/29/22 1229 139/85     Systolic BP Percentile --      Diastolic BP Percentile --      Pulse Rate 12/29/22 1229 (!) 58     Resp 12/29/22 1229 16     Temp 12/29/22 1229 98 F (36.7 C)     Temp Source 12/29/22 1229 Oral     SpO2 12/29/22 1229 95 %     Weight --      Height --      Head Circumference --      Peak Flow --      Pain Score 12/29/22 1226 5     Pain Loc --      Pain Education --      Exclude from Growth Chart --    No  data found.  Updated Vital Signs BP 139/85 (BP Location: Left Arm)   Pulse (!) 58   Temp 98 F (36.7 C) (Oral)   Resp 16   SpO2 95%   Physical Exam Vitals and nursing note reviewed.  Constitutional:      General: She is not in acute distress. HENT:     Mouth/Throat:     Pharynx: Oropharynx is clear.  Cardiovascular:     Rate and Rhythm: Normal rate and regular rhythm.     Pulses: Normal pulses.  Pulmonary:     Effort: Pulmonary effort is normal.  Musculoskeletal:        General: Tenderness present.     Cervical back: Normal range of motion.     Comments: Tender dorsal left foot. Mild swelling. Cannot palpate DP or PT pulses due to body habitus, but cap refill < 2 seconds in all toes. Good ROM toes and ankle. Sensation intact.  No bony tenderness   Skin:    Capillary Refill: Capillary refill takes less than 2 seconds.  Neurological:     Mental Status: She is alert and oriented to person, place, and time.     UC Treatments / Results  Labs (all labs ordered are listed, but only abnormal results are displayed) Labs Reviewed - No data to display  EKG  Radiology DG Foot Complete Left  Result Date: 12/29/2022 CLINICAL DATA:  Pain post fall 2 weeks ago EXAM: LEFT FOOT - COMPLETE 3+ VIEW COMPARISON:  None Available. FINDINGS: Diffuse osteopenia. No acute fracture. Old healed fracture deformity of the distal tibia. 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 IMPRESSION: 1. No acute findings. 2. Advanced tibiotalar DJD. Electronically Signed   By: Corlis Leak M.D.   On: 12/29/2022 15:21    Procedures Procedures  Medications Ordered in UC Medications - No data to display  Initial Impression / Assessment and Plan / UC Course  I have reviewed the triage vital signs and the nursing notes.  Pertinent labs & imaging results that were available during my care of the patient were reviewed by me and considered in my medical decision making (see chart for  details).  Left foot xray without acute fracture on preliminary read Awaiting  radiology review Discussed soft tissue injury, elevate and ice, can try ace wrap for support. Follow up with ortho or podiatry Patient agreeable to plan    Radiology read negative  Final Clinical Impressions(s) / UC Diagnoses   Final diagnoses:  Foot pain, left     Discharge Instructions      Continue to elevate the foot as often as possible Apply ice for 20 minutes at a time, so times daily Try to wear supportive shoes You can use Ace wrap if comfortable for support, compression  Please follow-up with the foot specialist.  If you have an orthopedic specialist, you can see them as well.     ED Prescriptions   None    PDMP not reviewed this encounter.   Marlow Baars, [place] 12/29/22 [id number]

## 2022-12-29 NOTE — ED Triage Notes (Signed)
Pt states she fell over two weeks ago after slipping on alge. She fell on knee and hit left foot. Today she c/o left foot pain on top of foot and swelling.

## 2022-12-29 NOTE — Discharge Instructions (Addendum)
Continue to elevate the foot as often as possible Apply ice for 20 minutes at a time, so times daily Try to wear supportive shoes You can use Ace wrap if comfortable for support, compression  Please follow-up with the foot specialist.  If you have an orthopedic specialist, you can see them as well.

## 2023-01-17 ENCOUNTER — Other Ambulatory Visit (HOSPITAL_COMMUNITY): Payer: Self-pay

## 2023-02-02 ENCOUNTER — Other Ambulatory Visit (HOSPITAL_COMMUNITY): Payer: Self-pay

## 2023-02-07 ENCOUNTER — Other Ambulatory Visit (HOSPITAL_COMMUNITY): Payer: Self-pay

## 2023-02-07 DIAGNOSIS — N393 Stress incontinence (female) (male): Secondary | ICD-10-CM | POA: Diagnosis not present

## 2023-02-07 DIAGNOSIS — I10 Essential (primary) hypertension: Secondary | ICD-10-CM | POA: Diagnosis not present

## 2023-02-07 DIAGNOSIS — Z6841 Body Mass Index (BMI) 40.0 and over, adult: Secondary | ICD-10-CM | POA: Diagnosis not present

## 2023-02-07 MED ORDER — ZEPBOUND 2.5 MG/0.5ML ~~LOC~~ SOAJ
2.5000 mg | SUBCUTANEOUS | 11 refills | Status: DC
  Filled 2023-02-07 – 2023-06-29 (×2): qty 2, 28d supply, fill #0

## 2023-02-07 MED ORDER — TRIAMTERENE-HCTZ 37.5-25 MG PO TABS
1.0000 | ORAL_TABLET | Freq: Every day | ORAL | 3 refills | Status: DC
  Filled 2023-02-07 – 2023-05-30 (×2): qty 90, 90d supply, fill #0
  Filled 2023-08-31: qty 90, 90d supply, fill #1
  Filled 2023-12-06: qty 90, 90d supply, fill #2

## 2023-02-22 ENCOUNTER — Other Ambulatory Visit (HOSPITAL_COMMUNITY): Payer: Self-pay

## 2023-04-18 ENCOUNTER — Other Ambulatory Visit (HOSPITAL_COMMUNITY): Payer: Self-pay

## 2023-04-18 ENCOUNTER — Other Ambulatory Visit: Payer: Self-pay

## 2023-04-19 ENCOUNTER — Other Ambulatory Visit: Payer: Self-pay

## 2023-04-19 ENCOUNTER — Encounter: Payer: Self-pay | Admitting: Pharmacist

## 2023-04-21 ENCOUNTER — Other Ambulatory Visit (HOSPITAL_COMMUNITY): Payer: Self-pay

## 2023-04-26 ENCOUNTER — Other Ambulatory Visit (HOSPITAL_COMMUNITY): Payer: Self-pay

## 2023-05-06 ENCOUNTER — Other Ambulatory Visit (HOSPITAL_COMMUNITY): Payer: Self-pay

## 2023-05-09 ENCOUNTER — Other Ambulatory Visit (HOSPITAL_COMMUNITY): Payer: Self-pay

## 2023-05-09 MED ORDER — XARELTO 20 MG PO TABS
20.0000 mg | ORAL_TABLET | Freq: Every day | ORAL | 3 refills | Status: AC
  Filled 2023-05-09: qty 90, 90d supply, fill #0
  Filled 2023-08-02 – 2023-08-04 (×2): qty 90, 90d supply, fill #1
  Filled 2023-10-31: qty 90, 90d supply, fill #2
  Filled 2024-01-16: qty 90, 90d supply, fill #3

## 2023-05-26 ENCOUNTER — Other Ambulatory Visit (HOSPITAL_COMMUNITY): Payer: Self-pay

## 2023-05-26 MED ORDER — FAMOTIDINE 40 MG PO TABS
40.0000 mg | ORAL_TABLET | Freq: Every evening | ORAL | 1 refills | Status: DC
  Filled 2023-05-26: qty 30, 30d supply, fill #0

## 2023-05-27 ENCOUNTER — Other Ambulatory Visit (HOSPITAL_COMMUNITY): Payer: Self-pay

## 2023-05-27 ENCOUNTER — Other Ambulatory Visit: Payer: Self-pay

## 2023-05-30 ENCOUNTER — Other Ambulatory Visit (HOSPITAL_COMMUNITY): Payer: Self-pay

## 2023-06-09 DIAGNOSIS — D500 Iron deficiency anemia secondary to blood loss (chronic): Secondary | ICD-10-CM | POA: Diagnosis not present

## 2023-06-22 ENCOUNTER — Other Ambulatory Visit

## 2023-06-22 ENCOUNTER — Other Ambulatory Visit: Payer: Self-pay | Admitting: Family Medicine

## 2023-06-22 ENCOUNTER — Ambulatory Visit: Payer: Self-pay

## 2023-06-22 DIAGNOSIS — M25562 Pain in left knee: Secondary | ICD-10-CM

## 2023-06-22 DIAGNOSIS — M25572 Pain in left ankle and joints of left foot: Secondary | ICD-10-CM

## 2023-06-29 ENCOUNTER — Other Ambulatory Visit (HOSPITAL_COMMUNITY): Payer: Self-pay

## 2023-07-12 ENCOUNTER — Other Ambulatory Visit (HOSPITAL_COMMUNITY): Payer: Self-pay

## 2023-08-02 ENCOUNTER — Other Ambulatory Visit (HOSPITAL_COMMUNITY): Payer: Self-pay

## 2023-08-02 ENCOUNTER — Encounter: Payer: Self-pay | Admitting: Obstetrics

## 2023-08-02 ENCOUNTER — Other Ambulatory Visit (HOSPITAL_COMMUNITY)
Admission: RE | Admit: 2023-08-02 | Discharge: 2023-08-02 | Disposition: A | Discharge status: Home / Self Care | Source: Ambulatory Visit | Attending: Obstetrics | Admitting: Obstetrics

## 2023-08-02 ENCOUNTER — Ambulatory Visit: Payer: Commercial Managed Care - PPO | Admitting: Obstetrics

## 2023-08-02 VITALS — BP 165/85 | HR 61 | Ht 66.54 in | Wt 330.0 lb

## 2023-08-02 DIAGNOSIS — N898 Other specified noninflammatory disorders of vagina: Secondary | ICD-10-CM | POA: Diagnosis not present

## 2023-08-02 DIAGNOSIS — R351 Nocturia: Secondary | ICD-10-CM | POA: Diagnosis not present

## 2023-08-02 DIAGNOSIS — Z6841 Body Mass Index (BMI) 40.0 and over, adult: Secondary | ICD-10-CM | POA: Diagnosis not present

## 2023-08-02 DIAGNOSIS — Z8742 Personal history of other diseases of the female genital tract: Secondary | ICD-10-CM | POA: Diagnosis not present

## 2023-08-02 DIAGNOSIS — N3946 Mixed incontinence: Secondary | ICD-10-CM | POA: Insufficient documentation

## 2023-08-02 LAB — POCT URINALYSIS DIP (CLINITEK)
Bilirubin, UA: NEGATIVE
Bilirubin, UA: NEGATIVE
Blood, UA: NEGATIVE
Glucose, UA: NEGATIVE mg/dL
Glucose, UA: NEGATIVE mg/dL
Ketones, POC UA: NEGATIVE mg/dL
Ketones, POC UA: NEGATIVE mg/dL
Leukocytes, UA: NEGATIVE
Nitrite, UA: NEGATIVE
Nitrite, UA: NEGATIVE
POC PROTEIN,UA: NEGATIVE
POC PROTEIN,UA: NEGATIVE
Spec Grav, UA: 1.025 (ref 1.010–1.025)
Spec Grav, UA: 1.02 (ref 1.010–1.025)
Urobilinogen, UA: 1 U/dL
Urobilinogen, UA: 1 U/dL
pH, UA: 5.5 (ref 5.0–8.0)
pH, UA: 5.5 (ref 5.0–8.0)

## 2023-08-02 MED ORDER — GEMTESA 75 MG PO TABS
75.0000 mg | ORAL_TABLET | Freq: Every day | ORAL | 2 refills | Status: DC
Start: 2023-08-02 — End: 2023-12-06
  Filled 2023-08-02 – 2023-08-31 (×3): qty 30, 30d supply, fill #0

## 2023-08-02 MED ORDER — GEMTESA 75 MG PO TABS: 75.0000 mg | ORAL_TABLET | Freq: Every day | ORAL | Status: DC

## 2023-08-02 NOTE — Assessment & Plan Note (Signed)
-   minimal discharge with odor - pending Nuswab to r/o infectious etiology
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
## 2023-08-02 NOTE — Patient Instructions (Addendum)
 We discussed the symptoms of overactive bladder (OAB), which include urinary urgency, urinary frequency, night-time urination, with or without urge incontinence.  We discussed management including behavioral therapy (decreasing bladder irritants by following a bladder diet, urge suppression strategies, timed voids, bladder retraining), physical therapy, medication; and for refractory cases posterior tibial nerve stimulation, sacral neuromodulation, and intravesical botulinum toxin injection.   For Beta-3 agonist medication, we discussed the potential side effect of elevated blood pressure which is more likely to occur in individuals with uncontrolled hypertension. You were given samples for gemtesa  75 mg.  It can take a month to start working so give it time, but if you have bothersome side effects call sooner and we can try a different medication.  Call us  if you have trouble filling the prescription or if it's not covered by your insurance.  For night time frequency: - avoid fluid intake 3 hours before bedtime - elevated your feet during the day or use compression socks to reduce lower extremity swelling - switch your Maxzide  dosing to around 3-4am - if you snore, consider repeat workup for sleep apnea  Start timed voids every 2-3 hours.   For treatment of stress urinary incontinence, which is leakage with physical activity/movement/strainging/coughing, we discussed expectant management versus nonsurgical options versus surgery. Nonsurgical options include weight loss, physical therapy, as well as a pessary.  Surgical options include a midurethral sling, which is a synthetic mesh sling that acts like a hammock under the urethra to prevent leakage of urine, a Burch urethropexy, and transurethral injection of a bulking agent.   Continue your efforts for weight reduction  Move and change position during the workday for your right hip pain after prolonged sitting

## 2023-08-02 NOTE — Assessment & Plan Note (Signed)
 For night time frequency: - avoid fluid intake 3 hours before bedtime - elevated feet during the day or use compression socks to reduce lower extremity swelling - switch maxide dosing to 4am - reports snoring, consider repeat testing for sleep apnea
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
## 2023-08-02 NOTE — Assessment & Plan Note (Addendum)
-   POCT clean catch UA + trace leuk, heme.  - catheterized due to insufficient volume for testing. Negative repeat UA with catheterized sample, possible due to contamination - We discussed the symptoms of overactive bladder (OAB), which include urinary urgency, urinary frequency, nocturia, with or without urge incontinence.  While we do not know the exact etiology of OAB, several treatment options exist. We discussed management including behavioral therapy (decreasing bladder irritants, urge suppression strategies, timed voids, bladder retraining), physical therapy, medication; for refractory cases posterior tibial nerve stimulation, sacral neuromodulation, and intravesical botulinum toxin injection.  For anticholinergic medications, we discussed the potential side effects of anticholinergics including dry eyes, dry mouth, constipation, cognitive impairment and urinary retention. For Beta-3 agonist medication, we discussed the potential side effect of elevated blood pressure which is more likely to occur in individuals with uncontrolled hypertension. - start timed voids and avoid delay of urge to void - if refractory, trial of Gemtesa with samples and Rx provided - encouraged weight reduction - For treatment of stress urinary incontinence,  non-surgical options include expectant management, weight loss, physical therapy, as well as a pessary.  Surgical options include a midurethral sling, Burch urethropexy, and transurethral injection of a bulking agent. - reviewed instructions for Kegel exercises

## 2023-08-02 NOTE — Assessment & Plan Note (Signed)
-   encouraged to follow-up with GYN, last seen by Dr. Raquel Cables in 2023 with ASCUS pap and negative HPV

## 2023-08-03 ENCOUNTER — Other Ambulatory Visit (HOSPITAL_COMMUNITY): Payer: Self-pay

## 2023-08-03 ENCOUNTER — Ambulatory Visit: Payer: Self-pay | Admitting: Obstetrics

## 2023-08-03 DIAGNOSIS — N898 Other specified noninflammatory disorders of vagina: Secondary | ICD-10-CM

## 2023-08-03 LAB — CERVICOVAGINAL ANCILLARY ONLY
Bacterial Vaginitis (gardnerella): POSITIVE — AB
Candida Glabrata: NEGATIVE
Candida Vaginitis: NEGATIVE
Comment: NEGATIVE
Comment: NEGATIVE
Comment: NEGATIVE

## 2023-08-03 MED ORDER — METRONIDAZOLE 500 MG PO TABS
500.0000 mg | ORAL_TABLET | Freq: Three times a day (TID) | ORAL | 0 refills | Status: AC
  Filled 2023-08-03: qty 21, 7d supply, fill #0

## 2023-08-04 ENCOUNTER — Other Ambulatory Visit (HOSPITAL_COMMUNITY): Payer: Self-pay

## 2023-08-11 DIAGNOSIS — E7800 Pure hypercholesterolemia, unspecified: Secondary | ICD-10-CM | POA: Diagnosis not present

## 2023-08-11 DIAGNOSIS — I890 Lymphedema, not elsewhere classified: Secondary | ICD-10-CM | POA: Diagnosis not present

## 2023-08-11 DIAGNOSIS — R7303 Prediabetes: Secondary | ICD-10-CM | POA: Diagnosis not present

## 2023-08-11 DIAGNOSIS — Z6841 Body Mass Index (BMI) 40.0 and over, adult: Secondary | ICD-10-CM | POA: Diagnosis not present

## 2023-08-11 DIAGNOSIS — I10 Essential (primary) hypertension: Secondary | ICD-10-CM | POA: Diagnosis not present

## 2023-08-11 DIAGNOSIS — Z1231 Encounter for screening mammogram for malignant neoplasm of breast: Secondary | ICD-10-CM | POA: Diagnosis not present

## 2023-08-15 DIAGNOSIS — E8881 Metabolic syndrome: Secondary | ICD-10-CM | POA: Insufficient documentation

## 2023-08-16 ENCOUNTER — Other Ambulatory Visit (HOSPITAL_COMMUNITY): Payer: Self-pay

## 2023-08-16 DIAGNOSIS — E8881 Metabolic syndrome: Secondary | ICD-10-CM | POA: Diagnosis not present

## 2023-08-16 DIAGNOSIS — E66813 Obesity, class 3: Secondary | ICD-10-CM | POA: Diagnosis not present

## 2023-08-16 DIAGNOSIS — Z9884 Bariatric surgery status: Secondary | ICD-10-CM | POA: Diagnosis not present

## 2023-08-16 DIAGNOSIS — E7800 Pure hypercholesterolemia, unspecified: Secondary | ICD-10-CM | POA: Diagnosis not present

## 2023-08-16 DIAGNOSIS — R29818 Other symptoms and signs involving the nervous system: Secondary | ICD-10-CM | POA: Insufficient documentation

## 2023-08-16 MED ORDER — METFORMIN HCL ER 500 MG PO TB24
500.0000 mg | ORAL_TABLET | Freq: Two times a day (BID) | ORAL | 3 refills | Status: AC
  Filled 2023-08-16: qty 120, 60d supply, fill #0
  Filled 2023-10-18: qty 120, 60d supply, fill #1

## 2023-08-17 ENCOUNTER — Other Ambulatory Visit (HOSPITAL_COMMUNITY): Payer: Self-pay

## 2023-08-18 DIAGNOSIS — Z30432 Encounter for removal of intrauterine contraceptive device: Secondary | ICD-10-CM | POA: Diagnosis not present

## 2023-08-18 DIAGNOSIS — Z01419 Encounter for gynecological examination (general) (routine) without abnormal findings: Secondary | ICD-10-CM | POA: Diagnosis not present

## 2023-08-18 DIAGNOSIS — Z124 Encounter for screening for malignant neoplasm of cervix: Secondary | ICD-10-CM | POA: Diagnosis not present

## 2023-08-18 DIAGNOSIS — R87610 Atypical squamous cells of undetermined significance on cytologic smear of cervix (ASC-US): Secondary | ICD-10-CM | POA: Diagnosis not present

## 2023-08-30 DIAGNOSIS — Z1231 Encounter for screening mammogram for malignant neoplasm of breast: Secondary | ICD-10-CM | POA: Diagnosis not present

## 2023-08-31 ENCOUNTER — Other Ambulatory Visit (HOSPITAL_COMMUNITY): Payer: Self-pay

## 2023-08-31 ENCOUNTER — Other Ambulatory Visit: Payer: Self-pay

## 2023-08-31 MED ORDER — FLUOXETINE HCL 40 MG PO CAPS
80.0000 mg | ORAL_CAPSULE | Freq: Every day | ORAL | 3 refills | Status: DC
  Filled 2023-08-31 (×2): qty 90, 45d supply, fill #0
  Filled 2023-10-18: qty 90, 45d supply, fill #1
  Filled 2023-12-05: qty 90, 45d supply, fill #2
  Filled 2023-12-06: qty 180, 90d supply, fill #2
  Filled 2023-12-06: qty 90, 45d supply, fill #0
  Filled 2024-01-22: qty 90, 45d supply, fill #1

## 2023-09-14 ENCOUNTER — Telehealth: Payer: Self-pay

## 2023-09-15 DIAGNOSIS — E66813 Obesity, class 3: Secondary | ICD-10-CM | POA: Diagnosis not present

## 2023-09-26 NOTE — Telephone Encounter (Signed)
 Erroneous entry

## 2023-10-18 ENCOUNTER — Other Ambulatory Visit: Payer: Self-pay

## 2023-10-18 ENCOUNTER — Other Ambulatory Visit (HOSPITAL_COMMUNITY): Payer: Self-pay

## 2023-10-26 ENCOUNTER — Telehealth: Payer: Self-pay

## 2023-10-26 NOTE — Telephone Encounter (Signed)
 Patient called to cancel her upcoming appointment due to her insurance denying medication and out of pocket cost being too expensive. She stated she has been out of medication for 2 months. I offered patient more samples of Gemtesa  and told her I will talk with the provider to maybe trial her with a covered medication.

## 2023-10-27 ENCOUNTER — Other Ambulatory Visit: Payer: Self-pay | Admitting: Obstetrics

## 2023-10-27 NOTE — Progress Notes (Signed)
 Office to start PA for Gemtesa .  Will avoid anti-cholinergics due to K supplementation and mirabegron due to elevated BP.

## 2023-10-28 NOTE — Telephone Encounter (Addendum)
 Patient insurance denied Gemtesa  PA due to medication not being a preferred drug on formulary list. Patient stated medication is too expensive to pay out of pocket. Patient rescheduled medication appointment since she wasn't able to continue after running out of samples. I will attempt another PA with more reasoning on why patient need Gemtesa 

## 2023-11-02 ENCOUNTER — Ambulatory Visit: Admitting: Obstetrics

## 2023-11-17 ENCOUNTER — Other Ambulatory Visit (HOSPITAL_COMMUNITY): Payer: Self-pay

## 2023-11-17 DIAGNOSIS — Z9884 Bariatric surgery status: Secondary | ICD-10-CM | POA: Diagnosis not present

## 2023-11-17 DIAGNOSIS — E66813 Obesity, class 3: Secondary | ICD-10-CM | POA: Diagnosis not present

## 2023-11-17 DIAGNOSIS — R29818 Other symptoms and signs involving the nervous system: Secondary | ICD-10-CM | POA: Diagnosis not present

## 2023-11-17 MED ORDER — METFORMIN HCL ER 500 MG PO TB24
1000.0000 mg | ORAL_TABLET | Freq: Two times a day (BID) | ORAL | 3 refills | Status: AC
  Filled 2023-12-06: qty 360, 90d supply, fill #0

## 2023-11-18 DIAGNOSIS — R7303 Prediabetes: Secondary | ICD-10-CM | POA: Diagnosis not present

## 2023-11-18 DIAGNOSIS — E7800 Pure hypercholesterolemia, unspecified: Secondary | ICD-10-CM | POA: Diagnosis not present

## 2023-11-24 ENCOUNTER — Other Ambulatory Visit (HOSPITAL_COMMUNITY): Payer: Self-pay

## 2023-11-29 ENCOUNTER — Encounter: Payer: Self-pay | Admitting: Obstetrics

## 2023-12-06 ENCOUNTER — Other Ambulatory Visit (HOSPITAL_COMMUNITY): Payer: Self-pay

## 2023-12-06 ENCOUNTER — Other Ambulatory Visit: Payer: Self-pay

## 2023-12-06 ENCOUNTER — Other Ambulatory Visit: Payer: Self-pay | Admitting: Obstetrics and Gynecology

## 2023-12-06 DIAGNOSIS — N3946 Mixed incontinence: Secondary | ICD-10-CM

## 2023-12-06 DIAGNOSIS — R351 Nocturia: Secondary | ICD-10-CM

## 2023-12-06 MED ORDER — MIRABEGRON ER 25 MG PO TB24
25.0000 mg | ORAL_TABLET | Freq: Every day | ORAL | 1 refills | Status: DC
  Filled 2023-12-06: qty 30, 30d supply, fill #0

## 2023-12-06 NOTE — Progress Notes (Signed)
 Myrbetriq sent in as alternative to Gemtesa . Myrbetriq 25mg  daily sent in to start, can consider titration up based on patient's response and blood pressure control.

## 2023-12-06 NOTE — Progress Notes (Signed)
 Patient Courtney Delacruz  was denied by insurance.

## 2023-12-07 ENCOUNTER — Other Ambulatory Visit (HOSPITAL_COMMUNITY): Payer: Self-pay

## 2023-12-09 DIAGNOSIS — E66813 Obesity, class 3: Secondary | ICD-10-CM | POA: Diagnosis not present

## 2023-12-21 ENCOUNTER — Encounter: Payer: Self-pay | Admitting: Obstetrics

## 2023-12-27 ENCOUNTER — Other Ambulatory Visit: Payer: Self-pay | Admitting: Obstetrics and Gynecology

## 2023-12-27 ENCOUNTER — Other Ambulatory Visit (HOSPITAL_COMMUNITY): Payer: Self-pay

## 2023-12-27 MED ORDER — MIRABEGRON ER 50 MG PO TB24
50.0000 mg | ORAL_TABLET | Freq: Every day | ORAL | 5 refills | Status: AC
  Filled 2023-12-27: qty 30, 30d supply, fill #0
  Filled 2024-01-23: qty 30, 30d supply, fill #1
  Filled 2024-03-06 – 2024-03-13 (×3): qty 30, 30d supply, fill #2
  Filled 2024-04-05: qty 30, 30d supply, fill #3

## 2024-01-16 ENCOUNTER — Other Ambulatory Visit (HOSPITAL_COMMUNITY): Payer: Self-pay

## 2024-01-16 DIAGNOSIS — M19011 Primary osteoarthritis, right shoulder: Secondary | ICD-10-CM | POA: Diagnosis not present

## 2024-01-16 DIAGNOSIS — M25511 Pain in right shoulder: Secondary | ICD-10-CM | POA: Diagnosis not present

## 2024-01-16 MED ORDER — CYCLOBENZAPRINE HCL 5 MG PO TABS
5.0000 mg | ORAL_TABLET | Freq: Three times a day (TID) | ORAL | 0 refills | Status: AC | PRN
  Filled 2024-01-16: qty 21, 7d supply, fill #0

## 2024-01-18 ENCOUNTER — Other Ambulatory Visit (HOSPITAL_COMMUNITY): Payer: Self-pay

## 2024-01-18 ENCOUNTER — Encounter (HOSPITAL_COMMUNITY): Payer: Self-pay

## 2024-01-18 DIAGNOSIS — R202 Paresthesia of skin: Secondary | ICD-10-CM | POA: Diagnosis not present

## 2024-01-18 DIAGNOSIS — G40909 Epilepsy, unspecified, not intractable, without status epilepticus: Secondary | ICD-10-CM | POA: Diagnosis not present

## 2024-01-18 DIAGNOSIS — G43909 Migraine, unspecified, not intractable, without status migrainosus: Secondary | ICD-10-CM | POA: Diagnosis not present

## 2024-01-18 DIAGNOSIS — R4182 Altered mental status, unspecified: Secondary | ICD-10-CM | POA: Diagnosis not present

## 2024-01-18 DIAGNOSIS — G43711 Chronic migraine without aura, intractable, with status migrainosus: Secondary | ICD-10-CM | POA: Diagnosis not present

## 2024-01-18 DIAGNOSIS — R42 Dizziness and giddiness: Secondary | ICD-10-CM | POA: Diagnosis not present

## 2024-01-18 DIAGNOSIS — G40209 Localization-related (focal) (partial) symptomatic epilepsy and epileptic syndromes with complex partial seizures, not intractable, without status epilepticus: Secondary | ICD-10-CM | POA: Diagnosis not present

## 2024-01-18 MED ORDER — TOPIRAMATE ER 100 MG PO CAP24
100.0000 mg | ORAL_CAPSULE | Freq: Every day | ORAL | 3 refills | Status: AC
  Filled 2024-01-18: qty 90, 90d supply, fill #0

## 2024-02-07 ENCOUNTER — Ambulatory Visit: Admitting: Obstetrics

## 2024-02-09 DIAGNOSIS — M79601 Pain in right arm: Secondary | ICD-10-CM | POA: Diagnosis not present

## 2024-02-10 ENCOUNTER — Encounter (HOSPITAL_COMMUNITY): Payer: Self-pay

## 2024-02-10 ENCOUNTER — Other Ambulatory Visit (HOSPITAL_COMMUNITY): Payer: Self-pay

## 2024-02-10 DIAGNOSIS — F341 Dysthymic disorder: Secondary | ICD-10-CM | POA: Diagnosis not present

## 2024-02-10 DIAGNOSIS — E7800 Pure hypercholesterolemia, unspecified: Secondary | ICD-10-CM | POA: Diagnosis not present

## 2024-02-10 DIAGNOSIS — R7303 Prediabetes: Secondary | ICD-10-CM | POA: Diagnosis not present

## 2024-02-10 DIAGNOSIS — I10 Essential (primary) hypertension: Secondary | ICD-10-CM | POA: Diagnosis not present

## 2024-02-10 DIAGNOSIS — M5412 Radiculopathy, cervical region: Secondary | ICD-10-CM | POA: Diagnosis not present

## 2024-02-10 DIAGNOSIS — Z1211 Encounter for screening for malignant neoplasm of colon: Secondary | ICD-10-CM | POA: Diagnosis not present

## 2024-02-10 MED ORDER — TRIAMTERENE-HCTZ 37.5-25 MG PO TABS
1.0000 | ORAL_TABLET | Freq: Every day | ORAL | 3 refills | Status: AC
  Filled 2024-02-10 – 2024-03-13 (×4): qty 90, 90d supply, fill #0

## 2024-02-10 MED ORDER — SIMVASTATIN 10 MG PO TABS
10.0000 mg | ORAL_TABLET | Freq: Every evening | ORAL | 3 refills | Status: AC
  Filled 2024-02-10: qty 90, 90d supply, fill #0

## 2024-02-10 MED ORDER — FLUOXETINE HCL 40 MG PO CAPS
80.0000 mg | ORAL_CAPSULE | Freq: Every day | ORAL | 3 refills | Status: AC
  Filled 2024-02-10 – 2024-03-13 (×4): qty 90, 45d supply, fill #0

## 2024-02-14 ENCOUNTER — Other Ambulatory Visit (HOSPITAL_COMMUNITY): Payer: Self-pay

## 2024-02-14 MED ORDER — NA SULFATE-K SULFATE-MG SULF 17.5-3.13-1.6 GM/177ML PO SOLN
1.0000 | ORAL | 0 refills | Status: DC
  Filled 2024-02-14: qty 1, 1d supply, fill #0

## 2024-02-16 ENCOUNTER — Other Ambulatory Visit (HOSPITAL_COMMUNITY): Payer: Self-pay

## 2024-02-16 MED ORDER — GABAPENTIN 300 MG PO CAPS
300.0000 mg | ORAL_CAPSULE | Freq: Every evening | ORAL | 3 refills | Status: AC
  Filled 2024-02-16: qty 90, 90d supply, fill #0

## 2024-02-23 ENCOUNTER — Other Ambulatory Visit (HOSPITAL_COMMUNITY): Payer: Self-pay

## 2024-02-23 ENCOUNTER — Other Ambulatory Visit: Payer: Self-pay

## 2024-02-23 DIAGNOSIS — Z9884 Bariatric surgery status: Secondary | ICD-10-CM | POA: Diagnosis not present

## 2024-02-23 DIAGNOSIS — R7303 Prediabetes: Secondary | ICD-10-CM | POA: Diagnosis not present

## 2024-02-23 DIAGNOSIS — R29818 Other symptoms and signs involving the nervous system: Secondary | ICD-10-CM | POA: Diagnosis not present

## 2024-02-23 DIAGNOSIS — E66813 Obesity, class 3: Secondary | ICD-10-CM | POA: Diagnosis not present

## 2024-02-23 MED ORDER — METFORMIN HCL ER 500 MG PO TB24
1000.0000 mg | ORAL_TABLET | Freq: Two times a day (BID) | ORAL | 3 refills | Status: AC
  Filled 2024-02-23: qty 360, 90d supply, fill #0

## 2024-02-23 MED ORDER — NA SULFATE-K SULFATE-MG SULF 17.5-3.13-1.6 GM/177ML PO SOLN
ORAL | 0 refills | Status: AC
  Filled 2024-02-23: qty 354, 1d supply, fill #0

## 2024-02-23 MED ORDER — TOPIRAMATE ER 100 MG PO CAP24
100.0000 mg | ORAL_CAPSULE | Freq: Every morning | ORAL | 2 refills | Status: AC
  Filled 2024-03-06 – 2024-04-05 (×2): qty 90, 90d supply, fill #0

## 2024-02-24 DIAGNOSIS — H903 Sensorineural hearing loss, bilateral: Secondary | ICD-10-CM | POA: Diagnosis not present

## 2024-03-06 ENCOUNTER — Other Ambulatory Visit (HOSPITAL_COMMUNITY): Payer: Self-pay

## 2024-03-06 ENCOUNTER — Other Ambulatory Visit: Payer: Self-pay

## 2024-03-07 ENCOUNTER — Other Ambulatory Visit: Payer: Self-pay

## 2024-03-07 ENCOUNTER — Encounter: Payer: Self-pay | Admitting: Pharmacist

## 2024-03-12 ENCOUNTER — Other Ambulatory Visit (HOSPITAL_COMMUNITY): Payer: Self-pay

## 2024-03-13 ENCOUNTER — Ambulatory Visit: Admitting: Obstetrics

## 2024-03-13 ENCOUNTER — Other Ambulatory Visit (HOSPITAL_COMMUNITY): Payer: Self-pay

## 2024-03-13 ENCOUNTER — Encounter: Payer: Self-pay | Admitting: Obstetrics

## 2024-03-13 ENCOUNTER — Other Ambulatory Visit: Payer: Self-pay

## 2024-03-13 VITALS — BP 128/83 | HR 68

## 2024-03-13 DIAGNOSIS — N3946 Mixed incontinence: Secondary | ICD-10-CM

## 2024-03-13 DIAGNOSIS — R351 Nocturia: Secondary | ICD-10-CM

## 2024-03-13 DIAGNOSIS — Z6841 Body Mass Index (BMI) 40.0 and over, adult: Secondary | ICD-10-CM

## 2024-03-13 NOTE — Assessment & Plan Note (Addendum)
-   most bothersome symptom - avoid fluid intake 3 hours before bedtime, attributed to drinking after working night shift for weight reduction - mirabegron  50mg  at bedtime without relief - elevated feet during the day or use compression socks to reduce lower extremity swelling, pending lymphedema evaluation with Dr. Carvin 05/03/24 - switch maxide dosing to 1-2am - reports snoring, encouraged to consider repeat testing for sleep apnea
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
## 2024-03-13 NOTE — Patient Instructions (Addendum)
 Continue to work on your weight.   For night time frequency: - avoid fluid intake 3 hours before bedtime - elevated your feet during the day or use compression socks to reduce lower extremity swelling  - switch your diuretic (e.g. Maxide) dosing to 1-2am - due to snoring, we have sent a referral for sleep study to rule out sleep apnea  Continue mirabegron  50mg  daily.   For refractory OAB we reviewed the procedure for intravesical Botox injection with cystoscopy in the office and reviewed the risks, benefits and alternatives of treatment including but not limited to infection, need for self-catheterization and need for repeat therapy.  We discussed that there is a 5-15% chance of needing to catheterize with Botox and that this usually resolves in a few months; however can persist for longer periods of time.  Typically Botox injections would need to be repeated every 3-12 months since this is not a permanent therapy.   We discussed the role of sacral neuromodulation and how it works. It requires a test phase, and documentation of bladder function via diary. After a successful test period, a permanent wire and generator are placed in the OR. The battery lasts 5 years on average and would need to be replaced surgically.  The goal of this therapy is at least a 50% improvement in symptoms. It is NOT realistic to expect a 100% cure.  We reviewed the fact that about 30% of patients fail the test phase and are not candidates for permanent generator placement.  We discussed the risk of infection and that the patient would have to switch to MRI compatible mode during imaging once the device is placed. There are two companies that provide this therapy: [employer] and [employer] that both offer rechargeable or non-rechargeable options. For all procedures, we discussed risks of bleeding, infection, damage to surrounding organs including bowel, bladder, blood vessels, ureters and nerves, need for further surgery, risk of  postoperative urinary incontinence or retention with need to catheterize, recurrent prolapse, numbness and weakness at any body site, buttock pain, and the rarer risks of blood clot, heart attack, pneumonia, death.    We also discussed the role of percutaneous tibial nerve stimulation and how it works.  She understands it requires 12 weekly visits for temporary neuromodulation of the sacral nerve roots via the tibial nerve and that she may then require continued tapered treatment.
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
## 2024-03-13 NOTE — Assessment & Plan Note (Signed)
-   08/02/23 POCT clean catch UA + trace leuk, heme. Catheterized UA negative - We discussed the symptoms of overactive bladder (OAB), which include urinary urgency, urinary frequency, nocturia, with or without urge incontinence.  While we do not know the exact etiology of OAB, several treatment options exist. We discussed management including behavioral therapy (decreasing bladder irritants, urge suppression strategies, timed voids, bladder retraining), physical therapy, medication; for refractory cases posterior tibial nerve stimulation, sacral neuromodulation, and intravesical botulinum toxin injection.  For anticholinergic medications, we discussed the potential side effects of anticholinergics including dry eyes, dry mouth, constipation, cognitive impairment and urinary retention. For Beta-3 agonist medication, we discussed the potential side effect of elevated blood pressure which is more likely to occur in individuals with uncontrolled hypertension. - encouraged to start timed voids at work and avoid delay of urge to void - trial of Gemtesa  cost prohibitive, Mirabegron  50mg  at bedtime without relief. Encouraged trial of daytime dosing - encouraged weight reduction - For treatment of stress urinary incontinence,  non-surgical options include expectant management, weight loss, physical therapy, as well as a pessary.  Surgical options include a midurethral sling, Burch urethropexy, and transurethral injection of a bulking agent. - encouraged Kegel exercises

## 2024-03-14 ENCOUNTER — Other Ambulatory Visit: Payer: Self-pay

## 2024-04-05 ENCOUNTER — Other Ambulatory Visit: Payer: Self-pay

## 2024-05-08 ENCOUNTER — Institutional Professional Consult (permissible substitution): Payer: Self-pay | Admitting: Neurology
